# Patient Record
Sex: Female | Born: 1985 | Race: White | Hispanic: No | Marital: Married | Smoking: Never smoker
Health system: Southern US, Community
[De-identification: ages and names within clinical notes are randomized; demographics above are authoritative.]

## PROBLEM LIST (undated history)

## (undated) DIAGNOSIS — M1990 Unspecified osteoarthritis, unspecified site: Secondary | ICD-10-CM

## (undated) DIAGNOSIS — B9689 Other specified bacterial agents as the cause of diseases classified elsewhere: Secondary | ICD-10-CM

## (undated) DIAGNOSIS — M5136 Other intervertebral disc degeneration, lumbar region: Secondary | ICD-10-CM

## (undated) DIAGNOSIS — R87612 Low grade squamous intraepithelial lesion on cytologic smear of cervix (LGSIL): Secondary | ICD-10-CM

## (undated) DIAGNOSIS — F329 Major depressive disorder, single episode, unspecified: Secondary | ICD-10-CM

## (undated) DIAGNOSIS — Z9289 Personal history of other medical treatment: Secondary | ICD-10-CM

## (undated) DIAGNOSIS — N760 Acute vaginitis: Secondary | ICD-10-CM

## (undated) DIAGNOSIS — R87619 Unspecified abnormal cytological findings in specimens from cervix uteri: Secondary | ICD-10-CM

## (undated) DIAGNOSIS — B977 Papillomavirus as the cause of diseases classified elsewhere: Secondary | ICD-10-CM

## (undated) DIAGNOSIS — I499 Cardiac arrhythmia, unspecified: Secondary | ICD-10-CM

## (undated) DIAGNOSIS — A630 Anogenital (venereal) warts: Secondary | ICD-10-CM

## (undated) HISTORY — DX: Low grade squamous intraepithelial lesion on cytologic smear of cervix (LGSIL): R87.612

## (undated) HISTORY — PX: COLPOSCOPY: SHX161

## (undated) HISTORY — DX: Unspecified abnormal cytological findings in specimens from cervix uteri: R87.619

## (undated) HISTORY — DX: Major depressive disorder, single episode, unspecified: F32.9

## (undated) HISTORY — DX: Papillomavirus as the cause of diseases classified elsewhere: B97.7

## (undated) HISTORY — DX: Anogenital (venereal) warts: A63.0

## (undated) HISTORY — DX: Other specified bacterial agents as the cause of diseases classified elsewhere: B96.89

## (undated) HISTORY — DX: Acute vaginitis: N76.0

## (undated) HISTORY — DX: Unspecified osteoarthritis, unspecified site: M19.90

## (undated) HISTORY — DX: Personal history of other medical treatment: Z92.89

## (undated) HISTORY — DX: Other intervertebral disc degeneration, lumbar region: M51.36

---

## 2008-11-23 ENCOUNTER — Emergency Department: Payer: Self-pay | Admitting: Emergency Medicine

## 2009-03-18 DIAGNOSIS — R87612 Low grade squamous intraepithelial lesion on cytologic smear of cervix (LGSIL): Secondary | ICD-10-CM

## 2009-03-18 HISTORY — DX: Low grade squamous intraepithelial lesion on cytologic smear of cervix (LGSIL): R87.612

## 2010-06-11 IMAGING — CT CT HEAD WITHOUT CONTRAST
2 series · 16 of 30 positions shown, 20 images · non-contrast
Comparison: none

REASON FOR EXAM: severe ha
COMMENTS:   LMP: > one month ago

PROCEDURE:     CT  - CT HEAD WITHOUT CONTRAST  - November 23, 2008 [DATE]
RESULT:     No intra-axial or extra-axial pathologic fluid or blood
collections identified. No mass lesions noted. No hydrocephalus noted. No
acute bony undermines identified.

[Series 2: without · axial · non-contrast · 0.42mm/px · z∈[-622,-502]mm · 13 of 30 slices shown, 17 images]
[im 3/30  brain]
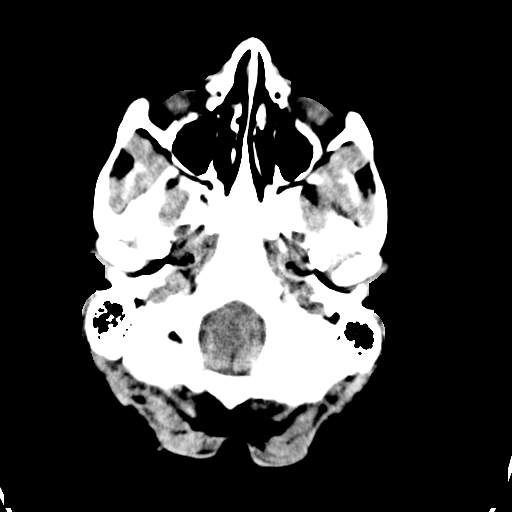
[im 3/30  bone]
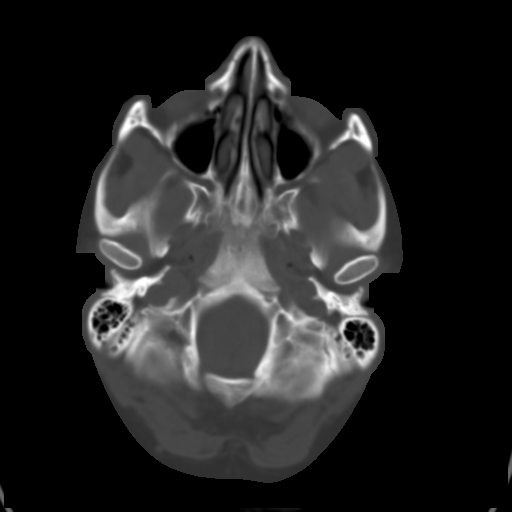
[im 5/30  brain]
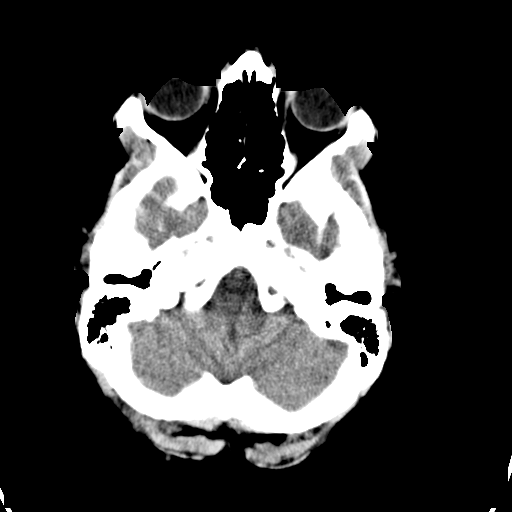
[im 7/30  brain]
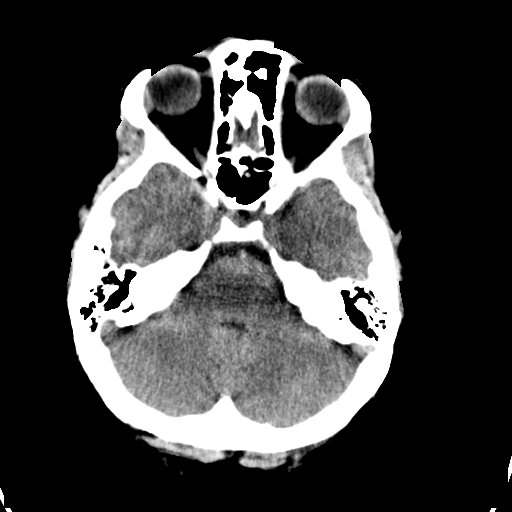
[im 9/30  brain]
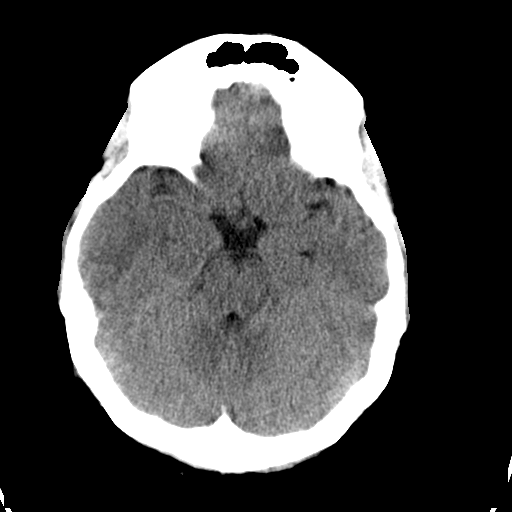
[im 11/30  brain]
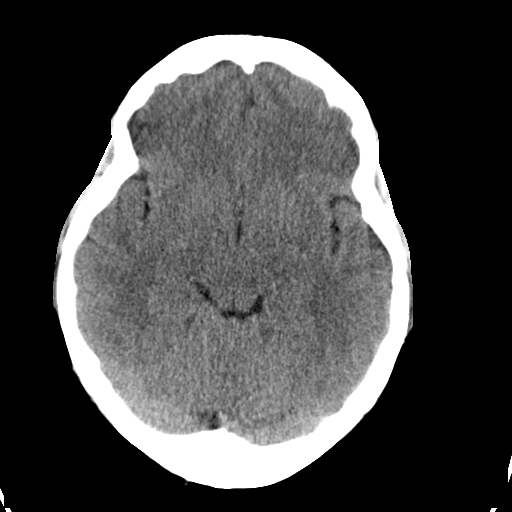
[im 11/30  bone]
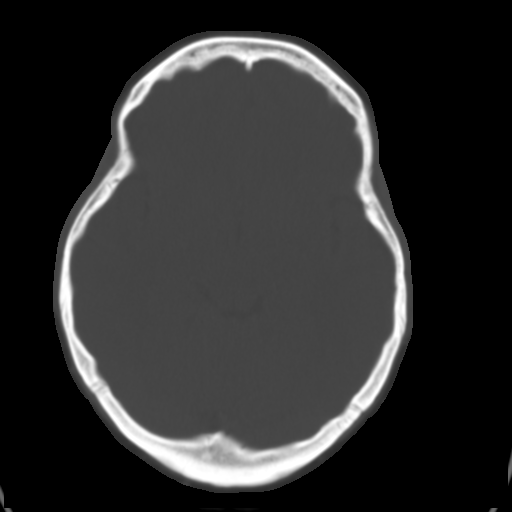
[im 13/30  brain]
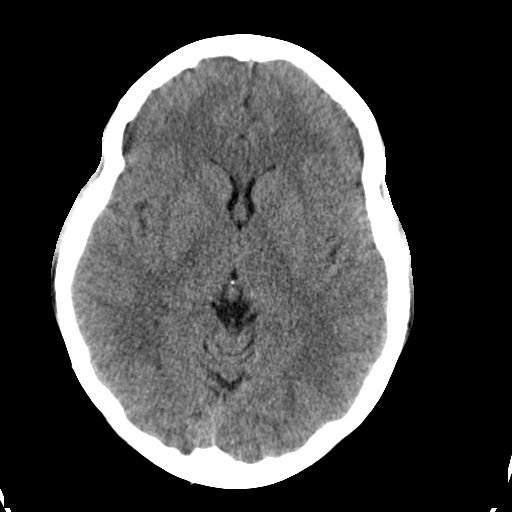
[im 15/30  brain]
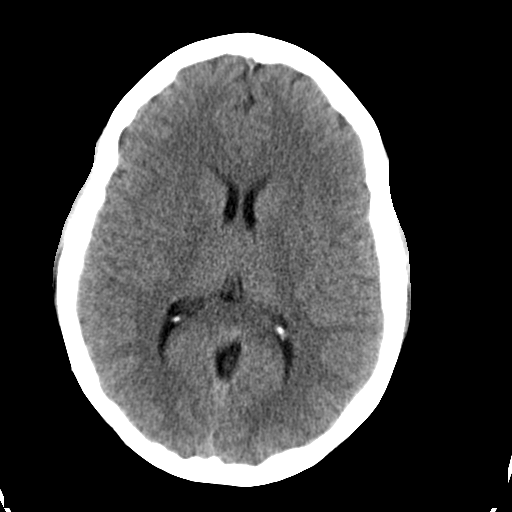
[im 17/30  brain]
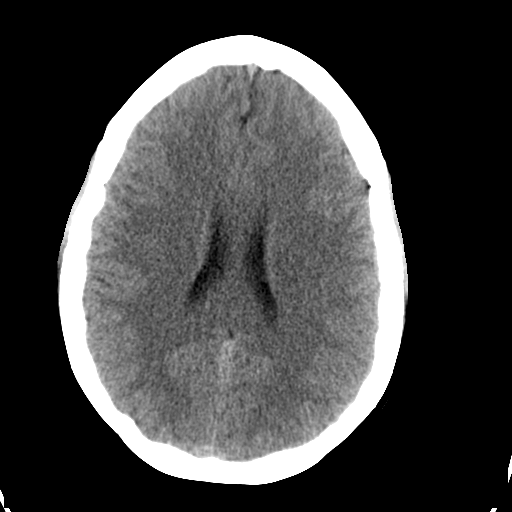
[im 19/30  brain]
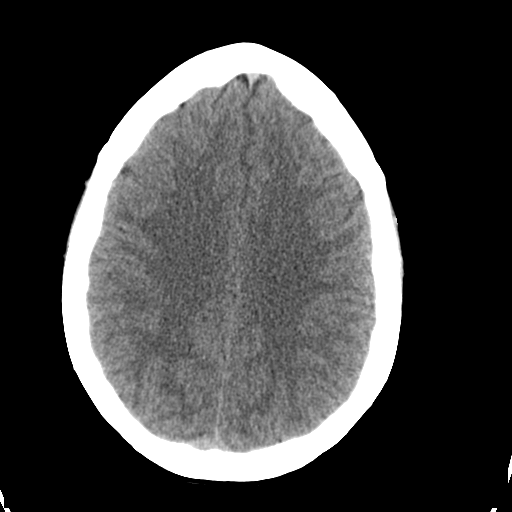
[im 19/30  bone]
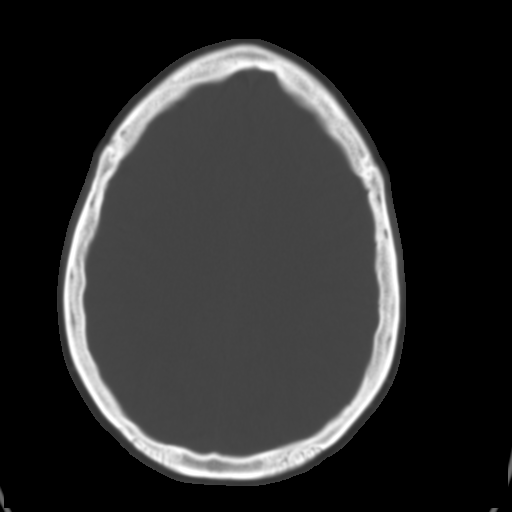
[im 21/30  brain]
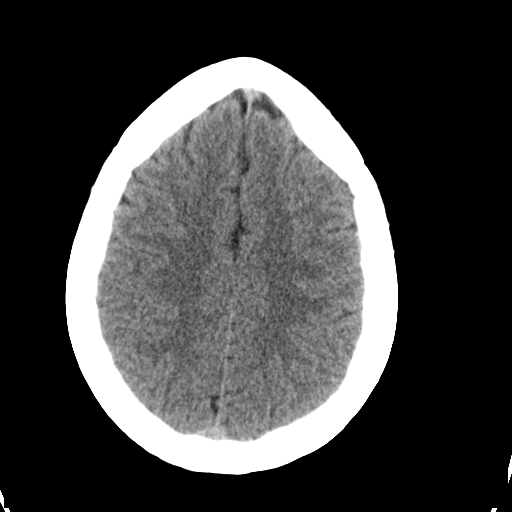
[im 23/30  brain]
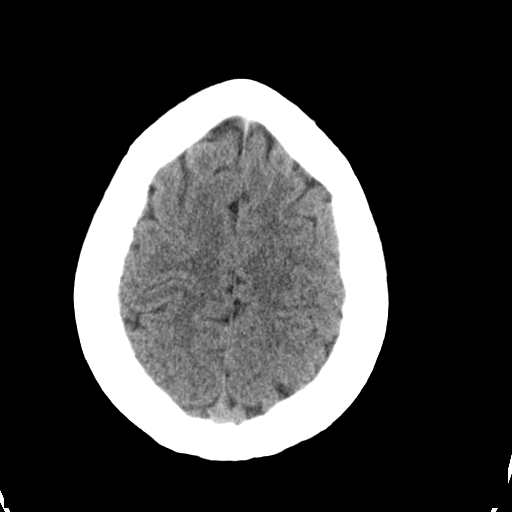
[im 25/30  brain]
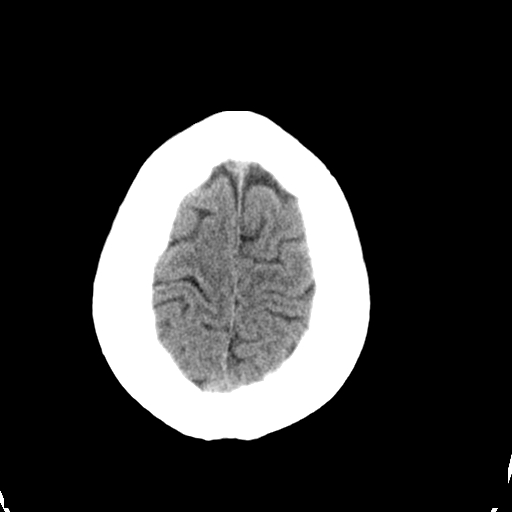
[im 27/30  brain]
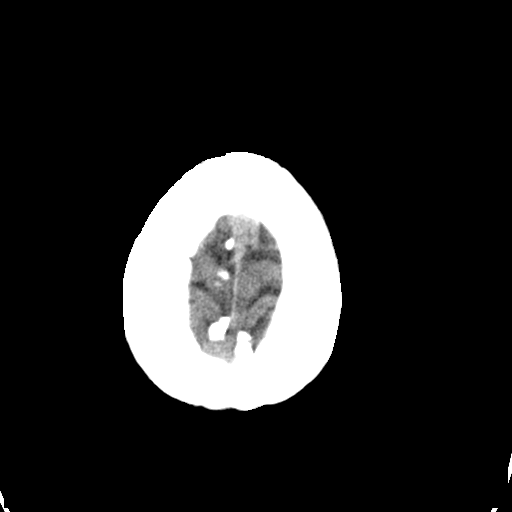
[im 27/30  bone]
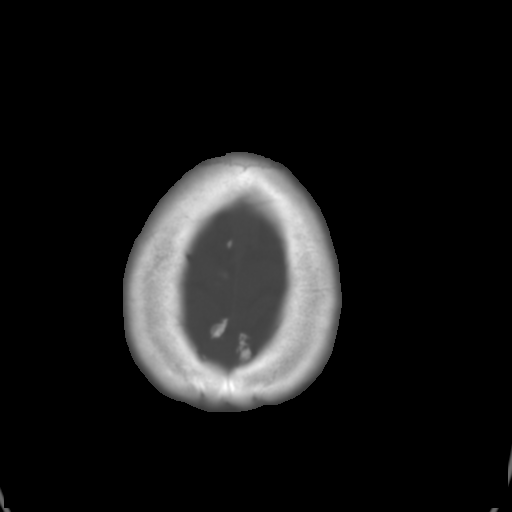

[Series 3: bone · axial · 0.42mm/px · z∈[-622,-582]mm · 3 of 30 slices shown]
[im 3/30  bone]
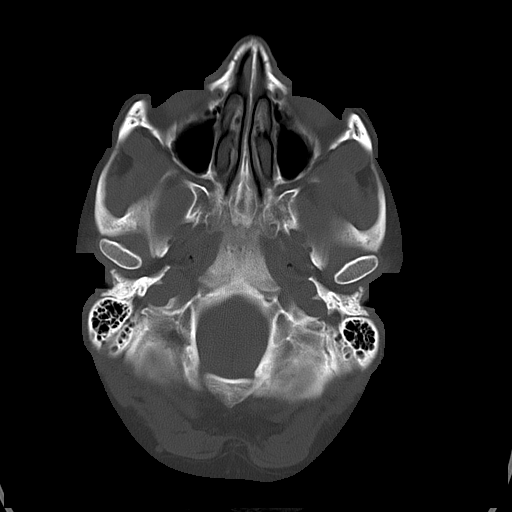
[im 7/30  bone]
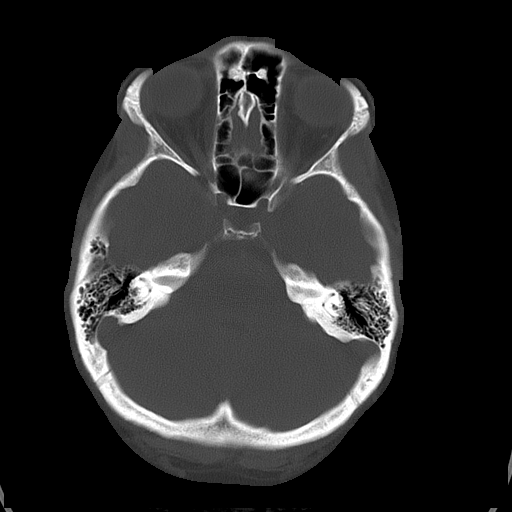
[im 11/30  bone]
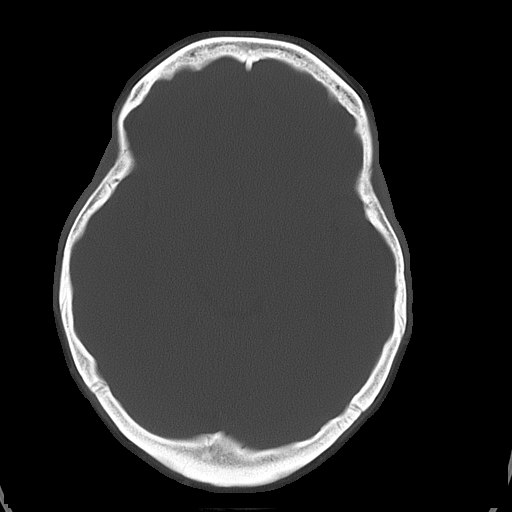

[16 of 30 positions shown; findings below may reference images not displayed]

IMPRESSION: No acute abnormality.

## 2010-06-28 DIAGNOSIS — M5136 Other intervertebral disc degeneration, lumbar region: Secondary | ICD-10-CM

## 2010-06-28 DIAGNOSIS — M51369 Other intervertebral disc degeneration, lumbar region without mention of lumbar back pain or lower extremity pain: Secondary | ICD-10-CM

## 2010-06-28 HISTORY — DX: Other intervertebral disc degeneration, lumbar region: M51.36

## 2010-06-28 HISTORY — DX: Other intervertebral disc degeneration, lumbar region without mention of lumbar back pain or lower extremity pain: M51.369

## 2011-06-29 DIAGNOSIS — F32A Depression, unspecified: Secondary | ICD-10-CM

## 2011-06-29 HISTORY — DX: Depression, unspecified: F32.A

## 2013-10-24 HISTORY — PX: INTRAUTERINE DEVICE (IUD) INSERTION: SHX5877

## 2014-04-26 DIAGNOSIS — Z9289 Personal history of other medical treatment: Secondary | ICD-10-CM

## 2014-04-26 HISTORY — DX: Personal history of other medical treatment: Z92.89

## 2016-10-04 ENCOUNTER — Telehealth: Payer: Self-pay | Admitting: Obstetrics and Gynecology

## 2016-10-04 NOTE — Telephone Encounter (Signed)
Pt coming 4/23 at 11:10 for skyla removal and kyleena insert.   Pt also stated that [facility] would call in something to soften her cervix since she does not get a cycle anymore.[facility] in [place]

## 2016-10-05 ENCOUNTER — Other Ambulatory Visit: Payer: Self-pay | Admitting: Obstetrics and Gynecology

## 2016-10-05 MED ORDER — MISOPROSTOL 100 MCG PO TABS
100.0000 ug | ORAL_TABLET | Freq: Once | ORAL | 0 refills | Status: DC
Start: 2016-10-05 — End: 2017-08-04

## 2016-10-05 NOTE — Telephone Encounter (Signed)
Rx sent 

## 2016-10-05 NOTE — Progress Notes (Signed)
yt

## 2016-10-05 NOTE — Telephone Encounter (Signed)
Noted. Will order to arrive by apt date/time. Msg to [facility] for rx.

## 2016-10-18 ENCOUNTER — Other Ambulatory Visit: Payer: Self-pay | Admitting: Obstetrics and Gynecology

## 2016-10-18 ENCOUNTER — Encounter: Payer: Self-pay | Admitting: Obstetrics and Gynecology

## 2016-10-18 ENCOUNTER — Ambulatory Visit (INDEPENDENT_AMBULATORY_CARE_PROVIDER_SITE_OTHER): Payer: PRIVATE HEALTH INSURANCE | Admitting: Obstetrics and Gynecology

## 2016-10-18 VITALS — BP 122/90 | HR 48 | Ht 63.0 in | Wt 286.0 lb

## 2016-10-18 DIAGNOSIS — Z131 Encounter for screening for diabetes mellitus: Secondary | ICD-10-CM

## 2016-10-18 DIAGNOSIS — Z13220 Encounter for screening for lipoid disorders: Secondary | ICD-10-CM

## 2016-10-18 DIAGNOSIS — Z0000 Encounter for general adult medical examination without abnormal findings: Secondary | ICD-10-CM

## 2016-10-18 DIAGNOSIS — Z30433 Encounter for removal and reinsertion of intrauterine contraceptive device: Secondary | ICD-10-CM

## 2016-10-18 NOTE — Patient Instructions (Signed)

## 2016-10-18 NOTE — Progress Notes (Signed)
   Chief Complaint  Patient presents with  . Procedure    skyla iud removal  . Procedure    kyleena iud insertion     History of Present Illness:  Lisvet Rasheed is a 31 y.o. that had a Skyla IUD placed approximately 3 years ago. Since that time, she states she has done well with the IUD. She would like a replacement today and plans to get the [facility].   BP 122/90 (Patient Position: Sitting)   Pulse (!) 48   Ht  (1.6 m)   Wt 286 lb (129.7 kg)   LMP  (LMP Unknown) Comment: iud  BMI 50.66 kg/m   Pelvic exam:  Two IUD strings present seen coming from the cervical os. EGBUS, vaginal vault and cervix: within normal limits  IUD Removal Strings of IUD identified and grasped.  IUD removed without problem with ring forceps.  Pt tolerated this well.  IUD noted to be intact.   IUD INSERTION NOTE:  Kanika Bungert is a 31 y.o. G0P0000 here for [facility]  IUD insertion. No GYN concerns.  Last pap smear was normal.  BP 122/90 (Patient Position: Sitting)   Pulse (!) 48   Ht  (1.6 m)   Wt 286 lb (129.7 kg)   LMP  (LMP Unknown) Comment: iud  BMI 50.66 kg/m   IUD Insertion Procedure Note Patient identified, informed consent performed, consent signed.   Discussed risks of irregular bleeding, cramping, infection, malpositioning or misplacement of the IUD outside the uterus which may require further procedure such as laparoscopy, risk of failure <1%. Time out was performed.   A bimanual exam showed the uterus to be anteverted.  Speculum placed in the vagina.  Cervix visualized.  Cleaned with Betadine x 2.  Grasped anteriorly with a single tooth tenaculum.  Uterus sounded to 8.0 cm.   IUD placed per manufacturer's recommendations.  Strings trimmed to 3 cm. Tenaculum was removed, good hemostasis noted.  Patient tolerated procedure well.   ASSESSMENT: Encounter for removal and reinsertion of intrauterine contraceptive device (IUD)  Blood tests for routine general physical examination -  Labs needed for work physical. Will call pt wiht results and fax labs to work.  Screening cholesterol level  Screening for diabetes mellitus    Meds ordered this encounter  Medications  . Levonorgestrel (KYLEENA) 19.5 MG IUD    Sig: 1 Device by Intrauterine route.     Plan:  Patient was given post-procedure instructions.  She was advised to have backup contraception for one week.   Call if you are having increasing pain, cramps or bleeding or if you have a fever greater than 100.4 degrees F., shaking chills, nausea or vomiting. Patient was also asked to check IUD strings periodically and follow up in 4 weeks for IUD check.  Return in about 4 weeks (around 11/15/2016) for IUD string check.  Tanija Germani B. Christophe Rising, PA-C 10/18/2016 11:42 AM

## 2016-10-29 LAB — COMPREHENSIVE METABOLIC PANEL
A/G RATIO: 1.4 (ref 1.2–2.2)
ALBUMIN: 4 g/dL (ref 3.5–5.5)
ALT: 16 IU/L (ref 0–32)
AST: 15 IU/L (ref 0–40)
Alkaline Phosphatase: 59 IU/L (ref 39–117)
BUN/Creatinine Ratio: 15 (ref 9–23)
BUN: 11 mg/dL (ref 6–20)
Bilirubin Total: 0.5 mg/dL (ref 0.0–1.2)
CO2: 25 mmol/L (ref 18–29)
CREATININE: 0.71 mg/dL (ref 0.57–1.00)
Calcium: 9.2 mg/dL (ref 8.7–10.2)
Chloride: 103 mmol/L (ref 96–106)
GFR, EST AFRICAN AMERICAN: 131 mL/min/{1.73_m2} (ref >59)
GFR, EST NON AFRICAN AMERICAN: 114 mL/min/{1.73_m2} (ref >59)
GLOBULIN, TOTAL: 2.9 g/dL (ref 1.5–4.5)
GLUCOSE: 97 mg/dL (ref 65–99)
POTASSIUM: 4.2 mmol/L (ref 3.5–5.2)
SODIUM: 141 mmol/L (ref 134–144)
TOTAL PROTEIN: 6.9 g/dL (ref 6.0–8.5)

## 2016-10-29 LAB — LIPID PANEL
CHOL/HDL RATIO: 3 ratio (ref 0.0–4.4)
CHOLESTEROL TOTAL: 166 mg/dL (ref 100–199)
HDL: 55 mg/dL (ref >39)
LDL Calculated: 101 mg/dL — ABNORMAL HIGH (ref 0–99)
TRIGLYCERIDES: 50 mg/dL (ref 0–149)
VLDL Cholesterol Cal: 10 mg/dL (ref 5–40)

## 2016-10-29 LAB — HEMOGLOBIN A1C
ESTIMATED AVERAGE GLUCOSE: 105 mg/dL
Hgb A1c MFr Bld: 5.3 % (ref 4.8–5.6)

## 2016-11-15 ENCOUNTER — Encounter: Payer: Self-pay | Admitting: Obstetrics and Gynecology

## 2016-11-15 ENCOUNTER — Ambulatory Visit (INDEPENDENT_AMBULATORY_CARE_PROVIDER_SITE_OTHER): Payer: PRIVATE HEALTH INSURANCE | Admitting: Obstetrics and Gynecology

## 2016-11-15 VITALS — BP 126/74 | Ht 63.0 in | Wt 290.0 lb

## 2016-11-15 DIAGNOSIS — Z30431 Encounter for routine checking of intrauterine contraceptive device: Secondary | ICD-10-CM

## 2016-11-15 NOTE — Progress Notes (Signed)
   Chief Complaint  Patient presents with  . Follow-up    IUD string check     History of Present Illness:  Kristina Mccall is a 31 y.o. that had a kyleena IUD placed approximately 1 month ago. Since that time, she denies dyspareunia, pelvic pain, non-menstrual bleeding, vaginal d/c, heavy bleeding. She is doing well.  Review of Systems  Physical Exam:  BP 126/74   Ht 5\' 3"  (1.6 m)   Wt 290 lb (131.5 kg)   LMP  (LMP Unknown) Comment: iud  BMI 51.37 kg/m  Body mass index is 51.37 kg/m.  Pelvic exam:  Two IUD strings present seen coming from the cervical os. EGBUS, vaginal vault and cervix: within normal limits   Assessment:  Routine checking of IUD Encounter for routine checking of intrauterine contraceptive device (IUD) - IUD in place. Doing well.   IUD strings present in proper location; pt doing well  Plan: F/u if any signs of infection or can no longer feel the strings.   Graceland Wachter B. Jenay Morici, PA-C 11/15/2016 2:12 PM

## 2017-06-09 ENCOUNTER — Ambulatory Visit: Payer: PRIVATE HEALTH INSURANCE | Admitting: Obstetrics and Gynecology

## 2017-08-04 ENCOUNTER — Ambulatory Visit (INDEPENDENT_AMBULATORY_CARE_PROVIDER_SITE_OTHER): Payer: PRIVATE HEALTH INSURANCE | Admitting: Obstetrics and Gynecology

## 2017-08-04 ENCOUNTER — Encounter: Payer: Self-pay | Admitting: Obstetrics and Gynecology

## 2017-08-04 VITALS — BP 116/82 | HR 82 | Ht 63.0 in | Wt 302.0 lb

## 2017-08-04 DIAGNOSIS — Z30431 Encounter for routine checking of intrauterine contraceptive device: Secondary | ICD-10-CM

## 2017-08-04 DIAGNOSIS — Z1151 Encounter for screening for human papillomavirus (HPV): Secondary | ICD-10-CM

## 2017-08-04 DIAGNOSIS — Z01419 Encounter for gynecological examination (general) (routine) without abnormal findings: Secondary | ICD-10-CM | POA: Diagnosis not present

## 2017-08-04 DIAGNOSIS — Z124 Encounter for screening for malignant neoplasm of cervix: Secondary | ICD-10-CM | POA: Diagnosis not present

## 2017-08-04 DIAGNOSIS — F419 Anxiety disorder, unspecified: Secondary | ICD-10-CM

## 2017-08-04 MED ORDER — PAROXETINE HCL 10 MG PO TABS: 10.0000 mg | ORAL_TABLET | Freq: Every day | ORAL | 3 refills | Status: DC

## 2017-08-04 NOTE — Patient Instructions (Signed)
I value your feedback and entrusting us with your care. If you get a [facility] patient survey, I would appreciate you taking the time to let us know about your experience today. Thank you! 

## 2017-08-04 NOTE — Progress Notes (Signed)
PCP:  Patient, No Pcp Per   Chief Complaint  Patient presents with  . Gynecologic Exam     HPI:      [name] is a 32 y.o. G0P0000 who LMP was No LMP recorded. Patient is not currently having periods (Reason: IUD)., presents today for her annual examination.  Her menses are absent with IUD. Dysmenorrhea none. She does not have intermenstrual bleeding.  Sex activity: single partner, contraception - IUD. Kyleena placed 10/18/16 Last Pap: April 29, 2016  Results were: no abnormalities  Hx of STDs: none  There is no FH of breast cancer. There is no FH of ovarian cancer. The patient does do self-breast exams.  Tobacco use: The patient denies current or previous tobacco use. Alcohol use: social drinker No drug use.  Exercise: moderately active  She does get adequate calcium and Vitamin D in her diet.  Trying to lose wt.   Pt still has anxiety. Took paxil 10mg  daily last yr with sx improvement. Went off it but feels she needs to restart. Felt stoic with zoloft in the past. Sx are sometimes exacerbated by stress. Has panic attacks with heart palpitations sometimes. Has decreased caffeine with heart sx improvement. No depression sx.   Past Medical History:  Diagnosis Date  . Abnormal Pap smear of cervix 05/28/2008;01/29/08;11/27/2012  . Arthritis   . Bacterial vaginosis   . Condyloma   . Degenerative disc disease, lumbar 06/2010  . Depression 2013  . History of Papanicolaou smear of cervix 04/26/2014   -/-; 04/29/2015 neg  . 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 COLPOSCOPY  2009/2010   no bx done  . INTRAUTERINE DEVICE (IUD) INSERTION  10/24/2013    Family History  Problem Relation Age of Onset  . Cancer Mother        cervical  . Hyperlipidemia Mother   . Hypertension Mother   . Hyperlipidemia  Father   . Diabetes Maternal Grandmother   . Heart disease Maternal Grandfather     Social History   Socioeconomic History  . Marital status: Single    Spouse name: Not on file  . Number of children: Not on file  . Years of education: Not on file  . Highest education level: Not on file  Social Needs  . Financial resource strain: Not on file  . Food insecurity - worry: Not on file  . Food insecurity - inability: Not on file  . Transportation needs - medical: Not on file  . Transportation needs - non-medical: Not on file  Occupational History  . Not on file  Tobacco Use  . Smoking status: Never Smoker  . Smokeless tobacco: Never Used  Substance and Sexual Activity  . Alcohol use: Yes    Comment: socially  . Drug use: No  . Sexual activity: Yes    Birth control/protection: IUD  Other Topics Concern  . Not on file  Social History Narrative  . Not on file    Current Meds  Medication Sig  . Levonorgestrel (KYLEENA) 19.5 MG IUD 1 Device by Intrauterine route.     ROS:  Review of Systems  Constitutional: Negative for fatigue, fever and unexpected weight change.  Respiratory: Negative for cough, shortness of breath and wheezing.   Cardiovascular: Negative for chest pain, palpitations and leg swelling.  Gastrointestinal: Negative  for blood in stool, constipation, diarrhea, nausea and vomiting.  Endocrine: Negative for cold intolerance, heat intolerance and polyuria.  Genitourinary: Negative for dyspareunia, dysuria, flank pain, frequency, genital sores, hematuria, menstrual problem, pelvic pain, urgency, vaginal bleeding, vaginal discharge and vaginal pain.  Musculoskeletal: Negative for back pain, joint swelling and myalgias.  Skin: Negative for rash.  Neurological: Negative for dizziness, syncope, light-headedness, numbness and headaches.  Hematological: Negative for adenopathy.  Psychiatric/Behavioral: Positive for agitation. Negative for confusion, dysphoric mood,  hallucinations, sleep disturbance and suicidal ideas. The patient is not nervous/anxious.      Objective: BP 116/82   Pulse 82   Ht 5\' 3"  (1.6 m)   Wt (!) 302 lb (137 kg)   BMI 53.50 kg/m    Physical Exam  Constitutional: She is oriented to person, place, and time. She appears well-developed and well-nourished.  Genitourinary: Vagina normal and uterus normal. There is no rash or tenderness on the right labia. There is no rash or tenderness on the left labia. No erythema or tenderness in the vagina. No vaginal discharge found. Right adnexum does not display mass and does not display tenderness. Left adnexum does not display mass and does not display tenderness.  Cervix exhibits visible IUD strings. Cervix does not exhibit motion tenderness or polyp. Uterus is not enlarged or tender.  Neck: Normal range of motion. No thyromegaly present.  Cardiovascular: Normal rate, regular rhythm and normal heart sounds.  No murmur heard. Pulmonary/Chest: Effort normal and breath sounds normal. Right breast exhibits no mass, no nipple discharge, no skin change and no tenderness. Left breast exhibits no mass, no nipple discharge, no skin change and no tenderness.  Abdominal: Soft. There is no tenderness. There is no guarding.  Musculoskeletal: Normal range of motion.  Neurological: She is alert and oriented to person, place, and time. No cranial nerve deficit.  Psychiatric: She has a normal mood and affect. Her behavior is normal.  Vitals reviewed.   Assessment/Plan: Encounter for annual routine gynecological examination  Cervical cancer screening - Plan: IGP, Aptima HPV  Screening for HPV (human papillomavirus) - Plan: IGP, Aptima HPV  Encounter for routine checking of intrauterine contraceptive device (IUD) - IUD in place. Due for rem 4/23.  Anxiety - Restart paxil 10 mg. Rx eRxd. F/u prn. - Plan: PARoxetine (PAXIL) 10 MG tablet  Meds ordered this encounter  Medications  . PARoxetine (PAXIL)  10 MG tablet    Sig: Take 1 tablet (10 mg total) by mouth daily.    Dispense:  90 tablet    Refill:  3    Order Specific Question:   Supervising Provider    Answer:   Nadara MustardHARRIS, ROBERT P [[id number]]             GYN counsel adequate intake of calcium and vitamin D, diet and exercise     F/U  Return in about 1 year (around 08/04/2018).  Wretha Laris B. Terrion Poblano, PA-C 08/04/2017 2:48 PM

## 2017-08-06 LAB — IGP, APTIMA HPV
HPV APTIMA: NEGATIVE
PAP SMEAR COMMENT: 0

## 2017-08-11 ENCOUNTER — Telehealth: Payer: Self-pay

## 2017-08-11 NOTE — Telephone Encounter (Signed)
Pt is [facility] for annual work physical/insurance requirements. She is aware they were last done in May of last year but insurance requires every calendar year regardless. CMP, lipid panel and hemoglobin A1c. Pt will schedule lab appt once orders in. Thank you!

## 2017-08-15 ENCOUNTER — Other Ambulatory Visit: Payer: Self-pay | Admitting: Obstetrics and Gynecology

## 2017-08-15 DIAGNOSIS — Z0000 Encounter for general adult medical examination without abnormal findings: Secondary | ICD-10-CM

## 2017-08-15 DIAGNOSIS — Z131 Encounter for screening for diabetes mellitus: Secondary | ICD-10-CM

## 2017-08-15 DIAGNOSIS — Z13220 Encounter for screening for lipoid disorders: Secondary | ICD-10-CM

## 2017-08-15 NOTE — Telephone Encounter (Signed)
Lab orders placed. Pls notify pt. Thx. 

## 2017-08-15 NOTE — Telephone Encounter (Signed)
Pt aware and will contact office to schedule lab appt.

## 2017-08-15 NOTE — Progress Notes (Signed)
Lab orders for work.

## 2017-10-07 ENCOUNTER — Other Ambulatory Visit: Payer: PRIVATE HEALTH INSURANCE

## 2017-10-07 DIAGNOSIS — Z0000 Encounter for general adult medical examination without abnormal findings: Secondary | ICD-10-CM

## 2017-10-07 DIAGNOSIS — Z13220 Encounter for screening for lipoid disorders: Secondary | ICD-10-CM

## 2017-10-07 DIAGNOSIS — Z131 Encounter for screening for diabetes mellitus: Secondary | ICD-10-CM

## 2017-10-08 LAB — COMPREHENSIVE METABOLIC PANEL
ALT: 23 IU/L (ref 0–32)
AST: 22 IU/L (ref 0–40)
Albumin/Globulin Ratio: 1.3 (ref 1.2–2.2)
Albumin: 4 g/dL (ref 3.5–5.5)
Alkaline Phosphatase: 78 IU/L (ref 39–117)
BUN/Creatinine Ratio: 18 (ref 9–23)
BUN: 12 mg/dL (ref 6–20)
Bilirubin Total: 0.5 mg/dL (ref 0.0–1.2)
CALCIUM: 9.2 mg/dL (ref 8.7–10.2)
CO2: 23 mmol/L (ref 20–29)
Chloride: 104 mmol/L (ref 96–106)
Creatinine, Ser: 0.67 mg/dL (ref 0.57–1.00)
GFR, EST AFRICAN AMERICAN: 135 mL/min/{1.73_m2} (ref >59)
GFR, EST NON AFRICAN AMERICAN: 117 mL/min/{1.73_m2} (ref >59)
GLUCOSE: 86 mg/dL (ref 65–99)
Globulin, Total: 3 g/dL (ref 1.5–4.5)
Potassium: 4 mmol/L (ref 3.5–5.2)
Sodium: 140 mmol/L (ref 134–144)
TOTAL PROTEIN: 7 g/dL (ref 6.0–8.5)

## 2017-10-08 LAB — LIPID PANEL
CHOL/HDL RATIO: 2.8 ratio (ref 0.0–4.4)
Cholesterol, Total: 163 mg/dL (ref 100–199)
HDL: 58 mg/dL (ref >39)
LDL CALC: 94 mg/dL (ref 0–99)
TRIGLYCERIDES: 56 mg/dL (ref 0–149)
VLDL Cholesterol Cal: 11 mg/dL (ref 5–40)

## 2017-10-08 LAB — HEMOGLOBIN A1C
Est. average glucose Bld gHb Est-mCnc: 111 mg/dL
HEMOGLOBIN A1C: 5.5 % (ref 4.8–5.6)

## 2017-10-09 ENCOUNTER — Encounter: Payer: Self-pay | Admitting: Obstetrics and Gynecology

## 2017-10-09 ENCOUNTER — Other Ambulatory Visit: Payer: Self-pay | Admitting: Obstetrics and Gynecology

## 2018-05-29 ENCOUNTER — Encounter: Payer: Self-pay | Admitting: Obstetrics and Gynecology

## 2018-05-29 ENCOUNTER — Ambulatory Visit (INDEPENDENT_AMBULATORY_CARE_PROVIDER_SITE_OTHER): Payer: PRIVATE HEALTH INSURANCE | Admitting: Obstetrics and Gynecology

## 2018-05-29 VITALS — BP 120/86 | HR 69 | Ht 63.0 in | Wt 312.0 lb

## 2018-05-29 DIAGNOSIS — R6882 Decreased libido: Secondary | ICD-10-CM | POA: Insufficient documentation

## 2018-05-29 DIAGNOSIS — F329 Major depressive disorder, single episode, unspecified: Secondary | ICD-10-CM

## 2018-05-29 DIAGNOSIS — F32A Depression, unspecified: Secondary | ICD-10-CM

## 2018-05-29 DIAGNOSIS — Z30431 Encounter for routine checking of intrauterine contraceptive device: Secondary | ICD-10-CM | POA: Diagnosis not present

## 2018-05-29 DIAGNOSIS — N898 Other specified noninflammatory disorders of vagina: Secondary | ICD-10-CM

## 2018-05-29 DIAGNOSIS — F419 Anxiety disorder, unspecified: Secondary | ICD-10-CM

## 2018-05-29 LAB — POCT WET PREP WITH KOH
Clue Cells Wet Prep HPF POC: NEGATIVE
KOH Prep POC: NEGATIVE
Trichomonas, UA: NEGATIVE
Yeast Wet Prep HPF POC: NEGATIVE

## 2018-05-29 MED ORDER — PAROXETINE HCL 10 MG PO TABS: ORAL_TABLET | ORAL | 1 refills | Status: DC

## 2018-05-29 NOTE — Progress Notes (Signed)
Patient, No Pcp Per   Chief Complaint  Patient presents with  . Vaginal Discharge    thin discharge for about 1 month, not everyday discharge, no odor, itchiness, or irritation, no pelvic pain    HPI:      [name] is a 32 y.o. G0P0000 who LMP was No LMP recorded. (Menstrual status: IUD)., presents today for increased watery d/c for the past month. No itching, irritation, odor. No meds to treat. No urin sx, no LBP, belly pain, fevers.  Kyleena placed 10/18/16. Doing well. Has occas spotting, monthly cramping when menses due.  Pt also with complaints of decreased sex drive for about 6 months. Recently married but pt isn't interested in sex. Not on any meds that could cause sx. Pt not exercising due to work sched. Also has mood changes, irritability, decreased concentration and motivation, and increased worry. Feels like Dr. Domenick GongJekyll and Mr. Sheppard PlumberHyde at times. No relationship issues with husband. Long hx of anxiety. Did zoloft in past but felt numb. Did well with paxil in 2017 but stopped it awhile back because things "were really good".  No SI.   Past Medical History:  Diagnosis Date  . Abnormal Pap smear of cervix 05/28/2008;01/29/08;11/27/2012  . Arthritis   . Bacterial vaginosis   . Condyloma   . Degenerative disc disease, lumbar 06/2010  . Depression 2013  . History of Papanicolaou smear of cervix 04/26/2014   -/-; 04/29/2015 neg  . 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 COLPOSCOPY  2009/2010   no bx done  . INTRAUTERINE DEVICE (IUD) INSERTION  10/24/2013    Family History  Problem Relation Age of Onset  . Cancer Mother        cervical  . Hyperlipidemia Mother   . Hypertension Mother   . Hyperlipidemia Father   . Diabetes Maternal Grandmother   . Heart disease Maternal Grandfather     Social  History   Socioeconomic History  . Marital status: Single    Spouse name: Not on file  . Number of children: Not on file  . Years of education: Not on file  . Highest education level: Not on file  Occupational History  . Not on file  Social Needs  . Financial resource strain: Not on file  . Food insecurity:    Worry: Not on file    Inability: Not on file  . Transportation needs:    Medical: Not on file    Non-medical: Not on file  Tobacco Use  . Smoking status: Never Smoker  . Smokeless tobacco: Never Used  Substance and Sexual Activity  . Alcohol use: Yes    Comment: socially  . Drug use: No  . Sexual activity: Yes    Birth control/protection: IUD    Comment: Kyleena  Lifestyle  . Physical activity:    Days per week: 4 days    Minutes per session: 60 min  . Stress: To some extent  Relationships  . Social connections:    Talks on phone: More than three times a week    Gets together: More than three times a week    Attends religious service: Never    Active member of club or organization: No    Attends meetings of clubs or organizations: Never    Relationship status: Living with  partner  . Intimate partner violence:    Fear of current or ex partner: No    Emotionally abused: No    Physically abused: No    Forced sexual activity: No  Other Topics Concern  . Not on file  Social History Narrative  . Not on file    Outpatient Medications Prior to Visit  Medication Sig Dispense Refill  . ibuprofen (ADVIL,MOTRIN) 800 MG tablet     . Levonorgestrel (KYLEENA) 19.5 MG IUD 1 Device by Intrauterine route.    Marland Kitchen PARoxetine (PAXIL) 10 MG tablet Take 1 tablet (10 mg total) by mouth daily. 90 tablet 3   No facility-administered medications prior to visit.       ROS:  Review of Systems  Constitutional: Negative for fever.  Gastrointestinal: Negative for blood in stool, constipation, diarrhea, nausea and vomiting.  Genitourinary: Positive for vaginal discharge.  Negative for dyspareunia, dysuria, flank pain, frequency, hematuria, urgency, vaginal bleeding and vaginal pain.  Musculoskeletal: Negative for back pain.  Skin: Negative for rash.  Psychiatric/Behavioral: Positive for agitation and dysphoric mood. Negative for hallucinations, self-injury and suicidal ideas.   BREAST: No symptoms   OBJECTIVE:   Vitals:  BP 120/86   Pulse 69   Ht 5\' 3"  (1.6 m)   Wt (!) 312 lb (141.5 kg)   BMI 55.27 kg/m   Physical Exam  Constitutional: She is oriented to person, place, and time. Vital signs are normal. She appears well-developed.  Pulmonary/Chest: Effort normal.  Genitourinary: Uterus normal. There is no rash, tenderness or lesion on the right labia. There is no rash, tenderness or lesion on the left labia. Uterus is not enlarged and not tender. Cervix exhibits no motion tenderness. Right adnexum displays no mass and no tenderness. Left adnexum displays no mass and no tenderness. There is bleeding in the vagina. No erythema or tenderness in the vagina. No vaginal discharge found.  Musculoskeletal: Normal range of motion.  Neurological: She is alert and oriented to person, place, and time.  Psychiatric: She has a normal mood and affect. Her behavior is normal. Thought content normal.  Vitals reviewed.   Results: Results for orders placed or performed in visit on 05/29/18 (from the past 24 hour(s))  POCT Wet Prep with KOH     Status: Normal   Collection Time: 05/29/18 12:17 PM  Result Value Ref Range   Trichomonas, UA Negative    Clue Cells Wet Prep HPF POC neg    Epithelial Wet Prep HPF POC     Yeast Wet Prep HPF POC neg    Bacteria Wet Prep HPF POC     RBC Wet Prep HPF POC     WBC Wet Prep HPF POC     KOH Prep POC Negative Negative   GAD 7 : Generalized Anxiety Score 05/29/2018  Nervous, Anxious, on Edge 2  Control/stop worrying 1  Worry too much - different things 1  Trouble relaxing 2  Restless 3  Easily annoyed or irritable 3  Afraid  - awful might happen 1  Total GAD 7 Score 13  Anxiety Difficulty Somewhat difficult     Depression screen PHQ 2/9 05/29/2018  Decreased Interest 1  Down, Depressed, Hopeless 1  PHQ - 2 Score 2  Altered sleeping 0  Tired, decreased energy 1  Change in appetite 2  Feeling bad or failure about yourself  2  Trouble concentrating 2  Moving slowly or fidgety/restless 2  Suicidal thoughts 0  PHQ-9 Score 11  Difficult doing  work/chores Somewhat difficult     Assessment/Plan: Vaginal discharge - Neg wet prep/exam. Check One Swab cultur for AV, BV. If neg, most likely normal physiologic d/c.  - Plan: Other/Misc lab test, POCT Wet Prep with KOH  Encounter for routine checking of intrauterine contraceptive device (IUD) - IUD in place.   Anxiety and depression - Pt with sx, question cause of decreased libido. Will restart paxil. Rx eRxd. RTO in 7 wks for f/u/sooner prn. - Plan: PARoxetine (PAXIL) 10 MG tablet  Decreased libido    Meds ordered this encounter  Medications  . PARoxetine (PAXIL) 10 MG tablet    Sig: Take 1/2 tab daily for 6 days, then 1 tab daily    Dispense:  30 tablet    Refill:  1    Order Specific Question:   Supervising Provider    Answer:   Nadara Mustard [[id number]]      Return in about 7 weeks (around 07/17/2018) for anxiety f/u.  Tejah Brekke B. Dannilynn Gallina, PA-C 05/29/2018 1:46 PM

## 2018-05-29 NOTE — Patient Instructions (Signed)
I value your feedback and entrusting us with your care. If you get a [facility] patient survey, I would appreciate you taking the time to let us know about your experience today. Thank you! 

## 2018-06-06 ENCOUNTER — Telehealth: Payer: Self-pay | Admitting: Obstetrics and Gynecology

## 2018-06-06 MED ORDER — CLINDAMYCIN PHOSPHATE 2 % VA CREA: 1.0000 | TOPICAL_CREAM | Freq: Every day | VAGINAL | 0 refills | Status: AC

## 2018-06-06 NOTE — Telephone Encounter (Signed)
Pt aware of GBS on One Swab culture. Neg BV. Will treat due to sx. Rx cleocin for 1 wk. Allergic to cipro and PCNs, both are first line in GBS tx. F/u prn. Doing well with paxil and yoga.

## 2018-12-14 ENCOUNTER — Encounter: Payer: Self-pay | Admitting: Obstetrics and Gynecology

## 2018-12-14 DIAGNOSIS — E162 Hypoglycemia, unspecified: Secondary | ICD-10-CM

## 2018-12-14 DIAGNOSIS — Z0000 Encounter for general adult medical examination without abnormal findings: Secondary | ICD-10-CM

## 2018-12-14 DIAGNOSIS — Z6841 Body Mass Index (BMI) 40.0 and over, adult: Secondary | ICD-10-CM

## 2018-12-14 DIAGNOSIS — Z131 Encounter for screening for diabetes mellitus: Secondary | ICD-10-CM

## 2018-12-14 NOTE — Telephone Encounter (Signed)
Check CMP and DM labs given hypoglycemia sx.

## 2018-12-15 ENCOUNTER — Other Ambulatory Visit: Payer: PRIVATE HEALTH INSURANCE

## 2018-12-15 ENCOUNTER — Other Ambulatory Visit: Payer: Self-pay

## 2018-12-15 DIAGNOSIS — Z131 Encounter for screening for diabetes mellitus: Secondary | ICD-10-CM

## 2018-12-15 DIAGNOSIS — Z6841 Body Mass Index (BMI) 40.0 and over, adult: Secondary | ICD-10-CM

## 2018-12-15 DIAGNOSIS — Z0000 Encounter for general adult medical examination without abnormal findings: Secondary | ICD-10-CM

## 2018-12-15 DIAGNOSIS — E162 Hypoglycemia, unspecified: Secondary | ICD-10-CM

## 2018-12-16 LAB — COMPREHENSIVE METABOLIC PANEL
ALT: 19 IU/L (ref 0–32)
AST: 17 IU/L (ref 0–40)
Albumin/Globulin Ratio: 1.4 (ref 1.2–2.2)
Albumin: 4 g/dL (ref 3.8–4.8)
Alkaline Phosphatase: 80 IU/L (ref 39–117)
BUN/Creatinine Ratio: 10 (ref 9–23)
BUN: 7 mg/dL (ref 6–20)
Bilirubin Total: 0.6 mg/dL (ref 0.0–1.2)
CO2: 20 mmol/L (ref 20–29)
Calcium: 9 mg/dL (ref 8.7–10.2)
Chloride: 106 mmol/L (ref 96–106)
Creatinine, Ser: 0.72 mg/dL (ref 0.57–1.00)
GFR calc Af Amer: 127 mL/min/{1.73_m2} (ref >59)
GFR calc non Af Amer: 110 mL/min/{1.73_m2} (ref >59)
Globulin, Total: 2.9 g/dL (ref 1.5–4.5)
Glucose: 83 mg/dL (ref 65–99)
Potassium: 3.9 mmol/L (ref 3.5–5.2)
Sodium: 142 mmol/L (ref 134–144)
Total Protein: 6.9 g/dL (ref 6.0–8.5)

## 2018-12-16 LAB — INSULIN, RANDOM: INSULIN: 8.1 u[IU]/mL (ref 2.6–24.9)

## 2018-12-16 LAB — HEMOGLOBIN A1C
Est. average glucose Bld gHb Est-mCnc: 108 mg/dL
Hgb A1c MFr Bld: 5.4 % (ref 4.8–5.6)

## 2019-05-08 NOTE — Progress Notes (Signed)
Patient, No Pcp Per   Chief Complaint  Patient presents with  . Vaginal Discharge    sour odor, no itchiness or irritation x 1 month    HPI:      [name] is a 33 y.o. G0P0000 who LMP was No LMP recorded. (Menstrual status: IUD)., presents today for increased d/c, with odor, without irritation for past month. Treated for yeast vag about 2 wks ago with monistat-7 with some improvement. No prior abx use, no soap/detergent change. No urin sx, LBP, pelvic pain, fevers. Hx of BV and GBS in past.   Kyleena placed 4/18, doing well. Amenorrheic. No new sex partners.   Last annual 2/19, has appt 12/20.  Patient Active Problem List   Diagnosis Date Noted  . Decreased libido 05/29/2018  . Anxiety and depression 05/29/2018    Past Surgical History:  Procedure Laterality Date  . COLPOSCOPY  2009/2010   no bx done  . INTRAUTERINE DEVICE (IUD) INSERTION  10/24/2013    Family History  Problem Relation Age of Onset  . Cancer Mother        cervical  . Hyperlipidemia Mother   . Hypertension Mother   . Hyperlipidemia Father   . Diabetes Maternal Grandmother   . Heart disease Maternal Grandfather     Social History   Socioeconomic History  . Marital status: Single    Spouse name: Not on file  . Number of children: Not on file  . Years of education: Not on file  . Highest education level: Not on file  Occupational History  . Not on file  Social Needs  . Financial resource strain: Not on file  . Food insecurity    Worry: Not on file    Inability: Not on file  . Transportation needs    Medical: Not on file    Non-medical: Not on file  Tobacco Use  . Smoking status: Never Smoker  . Smokeless tobacco: Never Used  Substance and Sexual Activity  . Alcohol use: Yes    Comment: socially  . Drug use: No  . Sexual activity: Yes    Birth control/protection: I.U.D.    Comment: Kyleena  Lifestyle  . Physical activity    Days per week: 4 days    Minutes per session:  60 min  . Stress: To some extent  Relationships  . Social connections    Talks on phone: More than three times a week    Gets together: More than three times a week    Attends religious service: Never    Active member of club or organization: No    Attends meetings of clubs or organizations: Never    Relationship status: Living with partner  . Intimate partner violence    Fear of current or ex partner: No    Emotionally abused: No    Physically abused: No    Forced sexual activity: No  Other Topics Concern  . Not on file  Social History Narrative  . Not on file    Outpatient Medications Prior to Visit  Medication Sig Dispense Refill  . ibuprofen (ADVIL,MOTRIN) 800 MG tablet     . Levonorgestrel (KYLEENA) 19.5 MG IUD 1 Device by Intrauterine route.    Marland Kitchen PARoxetine (PAXIL) 10 MG tablet Take 1/2 tab daily for 6 days, then 1 tab daily 30 tablet 1   No facility-administered medications prior to visit.       ROS:  Review of Systems  Constitutional: Positive  for fatigue. Negative for fever.  Gastrointestinal: Negative for blood in stool, constipation, diarrhea, nausea and vomiting.  Genitourinary: Positive for vaginal discharge. Negative for dyspareunia, dysuria, flank pain, frequency, hematuria, urgency, vaginal bleeding and vaginal pain.  Musculoskeletal: Negative for back pain.  Skin: Negative for rash.    OBJECTIVE:   Vitals:  BP 126/84   Ht 5\' 3"  (1.6 m)   Wt (!) 330 lb (149.7 kg)   BMI 58.46 kg/m   Physical Exam Vitals signs reviewed.  Constitutional:      Appearance: She is well-developed.  Neck:     Musculoskeletal: Normal range of motion.  Pulmonary:     Effort: Pulmonary effort is normal.  Genitourinary:    General: Normal vulva.     Pubic Area: No rash.      Labia:        Right: No rash, tenderness or lesion.        Left: No rash, tenderness or lesion.      Vagina: Vaginal discharge present. No erythema or tenderness.     Cervix: Normal.      Uterus: Normal. Not enlarged and not tender.      Adnexa: Right adnexa normal and left adnexa normal.       Right: No mass or tenderness.         Left: No mass or tenderness.    Musculoskeletal: Normal range of motion.  Skin:    General: Skin is warm and dry.  Neurological:     General: No focal deficit present.     Mental Status: She is alert and oriented to person, place, and time.  Psychiatric:        Mood and Affect: Mood normal.        Behavior: Behavior normal.        Thought Content: Thought content normal.        Judgment: Judgment normal.     Results: Results for orders placed or performed in visit on 05/09/19 (from the past 24 hour(s))  POCT Wet Prep with KOH     Status: Abnormal   Collection Time: 05/09/19 10:12 AM  Result Value Ref Range   Trichomonas, UA Negative    Clue Cells Wet Prep HPF POC pos    Epithelial Wet Prep HPF POC     Yeast Wet Prep HPF POC neg    Bacteria Wet Prep HPF POC     RBC Wet Prep HPF POC     WBC Wet Prep HPF POC     KOH Prep POC Negative Negative    Assessment/Plan: Bacterial vaginosis - Plan: POCT Wet Prep with KOH, metroNIDAZOLE (METROGEL) 0.75 % vaginal gel; Pos sx/wet prep. Rx metrogel. Add probiotics. F/u prn. Will RF if sx recur.   Meds ordered this encounter  Medications  . metroNIDAZOLE (METROGEL) 0.75 % vaginal gel    Sig: Place 1 Applicatorful vaginally at bedtime for 5 days.    Dispense:  50 g    Refill:  0    Order Specific Question:   Supervising Provider    Answer:   Gae Dry [[id number]]      Return if symptoms worsen or fail to improve.  Alicia B. Copland, PA-C 05/09/2019 10:12 AM

## 2019-05-09 ENCOUNTER — Encounter: Payer: Self-pay | Admitting: Obstetrics and Gynecology

## 2019-05-09 ENCOUNTER — Other Ambulatory Visit: Payer: Self-pay

## 2019-05-09 ENCOUNTER — Ambulatory Visit (INDEPENDENT_AMBULATORY_CARE_PROVIDER_SITE_OTHER): Payer: PRIVATE HEALTH INSURANCE | Admitting: Obstetrics and Gynecology

## 2019-05-09 VITALS — BP 126/84 | Ht 63.0 in | Wt 330.0 lb

## 2019-05-09 DIAGNOSIS — B9689 Other specified bacterial agents as the cause of diseases classified elsewhere: Secondary | ICD-10-CM | POA: Diagnosis not present

## 2019-05-09 DIAGNOSIS — N760 Acute vaginitis: Secondary | ICD-10-CM

## 2019-05-09 LAB — POCT WET PREP WITH KOH
Clue Cells Wet Prep HPF POC: POSITIVE
KOH Prep POC: NEGATIVE
Trichomonas, UA: NEGATIVE
Yeast Wet Prep HPF POC: NEGATIVE

## 2019-05-09 MED ORDER — METRONIDAZOLE 0.75 % VA GEL: 1.0000 | Freq: Every day | VAGINAL | 0 refills | Status: DC

## 2019-05-09 NOTE — Patient Instructions (Signed)
I value your feedback and entrusting us with your care. If you get a  patient survey, I would appreciate you taking the time to let us know about your experience today. Thank you! 

## 2019-05-31 ENCOUNTER — Encounter: Payer: Self-pay | Admitting: Obstetrics and Gynecology

## 2019-05-31 ENCOUNTER — Other Ambulatory Visit: Payer: Self-pay | Admitting: Obstetrics and Gynecology

## 2019-05-31 DIAGNOSIS — N760 Acute vaginitis: Secondary | ICD-10-CM

## 2019-05-31 MED ORDER — METRONIDAZOLE 0.75 % VA GEL: 1.0000 | Freq: Every day | VAGINAL | 0 refills | Status: AC

## 2019-05-31 NOTE — Progress Notes (Signed)
Rx RF metrogel for recurrent BV 

## 2019-06-05 NOTE — Progress Notes (Signed)
PCP:  Patient, No Pcp Per   Chief Complaint  Patient presents with  . Gynecologic Exam     HPI:      [name] is a 33 y.o. G0P0000 who LMP was No LMP recorded. (Menstrual status: IUD)., presents today for her annual examination.  Her menses are infrequent spotting/cramping for 1-2 days with IUD. No dyspareunia. Liked skyla in past better.  Sex activity: single partner, contraception - IUD. Kyleena placed 10/18/16 Last Pap: 08/04/17  Results were: no abnormalities /neg HPV DNA Hx of STDs: none  BV 11/20 treated with metrogel, again 12/20 with recurrence with sx relief.   There is no FH of breast cancer. There is no FH of ovarian cancer. The patient does do self-breast exams.  Tobacco use: The patient denies current or previous tobacco use. Alcohol use: social drinker No drug use.  Exercise: moderately active  She does get adequate calcium but not Vitamin D in her diet.  Trying to lose wt. Considering conception in near future. Trying to get to BMI<50 for [facility] delivery.    Past Medical History:  Diagnosis Date  . Abnormal Pap smear of cervix 05/28/2008;01/29/08;11/27/2012  . Arthritis   . Bacterial vaginosis   . Condyloma   . Degenerative disc disease, lumbar 06/2010  . Depression 2013  . History of Papanicolaou smear of cervix 04/26/2014   -/-; 04/29/2015 neg  . 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 COLPOSCOPY  2009/2010   no bx done  . INTRAUTERINE DEVICE (IUD) INSERTION  10/24/2013    Family History  Problem Relation Age of Onset  . Cancer Mother        cervical  . Hyperlipidemia Mother   . Hypertension Mother   . Hyperlipidemia Father   . Diabetes Maternal Grandmother   . Heart disease Maternal Grandfather     Social History   Socioeconomic History  . Marital status: Single     Spouse name: Not on file  . Number of children: Not on file  . Years of education: Not on file  . Highest education level: Not on file  Occupational History  . Not on file  Social Needs  . Financial resource strain: Not on file  . Food insecurity    Worry: Not on file    Inability: Not on file  . Transportation needs    Medical: Not on file    Non-medical: Not on file  Tobacco Use  . Smoking status: Never Smoker  . Smokeless tobacco: Never Used  Substance and Sexual Activity  . Alcohol use: Yes    Comment: socially  . Drug use: No  . Sexual activity: Yes    Birth control/protection: I.U.D.    Comment: Kyleena  Lifestyle  . Physical activity    Days per week: 4 days    Minutes per session: 60 min  . Stress: To some extent  Relationships  . Social connections    Talks on phone: More than three times a week    Gets together: More than three times a week    Attends religious service: Never    Active member of club or organization: No    Attends meetings of clubs or organizations: Never    Relationship status: Living with partner  . Intimate partner violence    Fear of  current or ex partner: No    Emotionally abused: No    Physically abused: No    Forced sexual activity: No  Other Topics Concern  . Not on file  Social History Narrative  . Not on file    Current Meds  Medication Sig  . ibuprofen (ADVIL,MOTRIN) 800 MG tablet   . Levonorgestrel (KYLEENA) 19.5 MG IUD 1 Device by Intrauterine route.     ROS:  Review of Systems  Constitutional: Negative for fatigue, fever and unexpected weight change.  Respiratory: Negative for cough, shortness of breath and wheezing.   Cardiovascular: Negative for chest pain, palpitations and leg swelling.  Gastrointestinal: Negative for blood in stool, constipation, diarrhea, nausea and vomiting.  Endocrine: Negative for cold intolerance, heat intolerance and polyuria.  Genitourinary: Negative for dyspareunia, dysuria, flank  pain, frequency, genital sores, hematuria, menstrual problem, pelvic pain, urgency, vaginal bleeding, vaginal discharge and vaginal pain.  Musculoskeletal: Negative for back pain, joint swelling and myalgias.  Skin: Negative for rash.  Neurological: Negative for dizziness, syncope, light-headedness, numbness and headaches.  Hematological: Negative for adenopathy.  Psychiatric/Behavioral: Negative for agitation, confusion, dysphoric mood, hallucinations, sleep disturbance and suicidal ideas. The patient is not nervous/anxious.      Objective: BP 118/84   Ht  (1.6 m)   Wt (!) 315 lb (142.9 kg)   BMI 55.80 kg/m    Physical Exam Constitutional:      Appearance: She is well-developed.  Genitourinary:     Vulva, vagina, uterus, right adnexa and left adnexa normal.     No vulval lesion or tenderness noted.     No vaginal discharge, erythema or tenderness.     No cervical motion tenderness or polyp.     IUD strings visualized.     Uterus is not enlarged or tender.     No right or left adnexal mass present.     Right adnexa not tender.     Left adnexa not tender.  Neck:     Musculoskeletal: Normal range of motion.     Thyroid: No thyromegaly.  Cardiovascular:     Rate and Rhythm: Normal rate and regular rhythm.     Heart sounds: Normal heart sounds. No murmur.  Pulmonary:     Effort: Pulmonary effort is normal.     Breath sounds: Normal breath sounds.  Chest:     Breasts:        Right: No mass, nipple discharge, skin change or tenderness.        Left: No mass, nipple discharge, skin change or tenderness.  Abdominal:     Palpations: Abdomen is soft.     Tenderness: There is no abdominal tenderness. There is no guarding.  Musculoskeletal: Normal range of motion.  Neurological:     General: No focal deficit present.     Mental Status: She is alert and oriented to person, place, and time.     Cranial Nerves: No cranial nerve deficit.  Skin:    General: Skin is warm and  dry.  Psychiatric:        Mood and Affect: Mood normal.        Behavior: Behavior normal.        Thought Content: Thought content normal.        Judgment: Judgment normal.  Vitals signs reviewed.     Assessment/Plan: Encounter for annual routine gynecological examination  Encounter for routine checking of intrauterine contraceptive device (IUD); IUD in place.   Bacterial vaginosis--will change to clindamycin  if sx recur. F/u prn.             GYN counsel adequate intake of calcium and vitamin D, diet and exercise     F/U  Return in about 1 year (around 06/05/2020).   B. , PA-C 06/06/2019 12:03 PM

## 2019-06-06 ENCOUNTER — Encounter: Payer: Self-pay | Admitting: Obstetrics and Gynecology

## 2019-06-06 ENCOUNTER — Other Ambulatory Visit: Payer: Self-pay

## 2019-06-06 ENCOUNTER — Ambulatory Visit (INDEPENDENT_AMBULATORY_CARE_PROVIDER_SITE_OTHER): Payer: PRIVATE HEALTH INSURANCE | Admitting: Obstetrics and Gynecology

## 2019-06-06 VITALS — BP 118/84 | Ht 63.0 in | Wt 315.0 lb

## 2019-06-06 DIAGNOSIS — Z01419 Encounter for gynecological examination (general) (routine) without abnormal findings: Secondary | ICD-10-CM | POA: Diagnosis not present

## 2019-06-06 DIAGNOSIS — Z30431 Encounter for routine checking of intrauterine contraceptive device: Secondary | ICD-10-CM

## 2019-06-06 DIAGNOSIS — N760 Acute vaginitis: Secondary | ICD-10-CM

## 2019-06-06 DIAGNOSIS — B9689 Other specified bacterial agents as the cause of diseases classified elsewhere: Secondary | ICD-10-CM

## 2019-06-06 NOTE — Patient Instructions (Signed)
I value your feedback and entrusting us with your care. If you get a [facility] patient survey, I would appreciate you taking the time to let us know about your experience today. Thank you! 

## 2019-12-05 ENCOUNTER — Encounter: Payer: Self-pay | Admitting: Obstetrics and Gynecology

## 2020-03-21 ENCOUNTER — Telehealth: Payer: Self-pay

## 2020-03-21 NOTE — Telephone Encounter (Signed)
Pt requested appt via mychart, per [facility], I have called pt twice to get more info on issues going on to get appt. Both times called no answer, LVMTRC.

## 2020-07-16 ENCOUNTER — Ambulatory Visit: Payer: PRIVATE HEALTH INSURANCE | Admitting: Obstetrics and Gynecology

## 2020-08-18 ENCOUNTER — Ambulatory Visit (INDEPENDENT_AMBULATORY_CARE_PROVIDER_SITE_OTHER): Payer: Managed Care, Other (non HMO) | Admitting: Obstetrics and Gynecology

## 2020-08-18 ENCOUNTER — Other Ambulatory Visit (HOSPITAL_COMMUNITY)
Admission: RE | Admit: 2020-08-18 | Discharge: 2020-08-18 | Disposition: A | Discharge status: Home / Self Care | Payer: Managed Care, Other (non HMO) | Source: Ambulatory Visit | Attending: Obstetrics and Gynecology | Admitting: Obstetrics and Gynecology

## 2020-08-18 ENCOUNTER — Encounter: Payer: Self-pay | Admitting: Obstetrics and Gynecology

## 2020-08-18 ENCOUNTER — Other Ambulatory Visit: Payer: Self-pay

## 2020-08-18 VITALS — BP 126/80 | Ht 63.0 in | Wt 310.0 lb

## 2020-08-18 DIAGNOSIS — Z01419 Encounter for gynecological examination (general) (routine) without abnormal findings: Secondary | ICD-10-CM | POA: Diagnosis not present

## 2020-08-18 DIAGNOSIS — Z30432 Encounter for removal of intrauterine contraceptive device: Secondary | ICD-10-CM

## 2020-08-18 DIAGNOSIS — Z3169 Encounter for other general counseling and advice on procreation: Secondary | ICD-10-CM

## 2020-08-18 DIAGNOSIS — Z87410 Personal history of cervical dysplasia: Secondary | ICD-10-CM

## 2020-08-18 DIAGNOSIS — Z1151 Encounter for screening for human papillomavirus (HPV): Secondary | ICD-10-CM | POA: Insufficient documentation

## 2020-08-18 DIAGNOSIS — Z124 Encounter for screening for malignant neoplasm of cervix: Secondary | ICD-10-CM

## 2020-08-18 NOTE — Progress Notes (Signed)
PCP:  Patient, No Pcp Per   Chief Complaint  Patient presents with  . Gynecologic Exam    IUD removal, plans to conceive     HPI:      [name] is a 35 y.o. G0P0000 who LMP was No LMP recorded. (Menstrual status: IUD)., presents today for her annual examination.  Her menses are now absent with IUD. Has mild monthly cramping. Hx of irreg menses in past but has been on pills, depo and now IUD for many yrs. Not sure of how periods will be without IUD.  Sex activity: single partner, contraception - IUD. Kyleena placed 10/18/16. Wants removed for conception. Not taking PNVs. Last Pap: 08/04/17  Results were: no abnormalities /neg HPV DNA Hx of STDs: HPV/abnormal paps 2009/2010  There is no FH of breast cancer. There is no FH of ovarian cancer. The patient does do self-breast exams.  Tobacco use: The patient denies current or previous tobacco use. Alcohol use: social drinker No drug use.  Exercise: moderately active  She does get adequate calcium but not Vitamin D in her diet.  Past Medical History:  Diagnosis Date  . Abnormal Pap smear of cervix 05/28/2008;01/29/08;11/27/2012  . Arthritis   . Bacterial vaginosis   . Condyloma   . Degenerative disc disease, lumbar 06/2010  . Depression 2013  . History of Papanicolaou smear of cervix 04/26/2014   -/-; 04/29/2015 neg  . 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 COLPOSCOPY  2009/2010   no bx done  . INTRAUTERINE DEVICE (IUD) INSERTION  10/24/2013    Family History  Problem Relation Age of Onset  . Cancer Mother        cervical  . Hyperlipidemia Mother   . Hypertension Mother   . Hyperlipidemia Father   . Diabetes Maternal Grandmother   . Heart disease Maternal Grandfather     Social History   Socioeconomic History  . Marital status: Married     Spouse name: Not on file  . Number of children: Not on file  . Years of education: Not on file  . Highest education level: Not on file  Occupational History  . Not on file  Tobacco Use  . Smoking status: Never Smoker  . Smokeless tobacco: Never Used  Vaping Use  . Vaping Use: Never used  Substance and Sexual Activity  . Alcohol use: Yes    Comment: socially  . Drug use: No  . Sexual activity: Yes    Birth control/protection: I.U.D.    Comment: Kyleena  Other Topics Concern  . Not on file  Social History Narrative  . Not on file   Social Determinants of Health   Financial Resource Strain: Not on file  Food Insecurity: Not on file  Transportation Needs: Not on file  Physical Activity: Not on file  Stress: Not on file  Social Connections: Not on file  Intimate Partner Violence: Not on file    Current Meds  Medication Sig  . [DISCONTINUED] levonorgestrel (KYLEENA) 19.5 MG IUD 1 Device by Intrauterine route.     ROS:  Review of Systems  Constitutional: Negative for fatigue, fever and unexpected weight change.  Respiratory: Negative for cough, shortness of breath and wheezing.   Cardiovascular: Negative for chest pain, palpitations and leg swelling.  Gastrointestinal: Negative for blood in stool,  constipation, diarrhea, nausea and vomiting.  Endocrine: Negative for cold intolerance, heat intolerance and polyuria.  Genitourinary: Negative for dyspareunia, dysuria, flank pain, frequency, genital sores, hematuria, menstrual problem, pelvic pain, urgency, vaginal bleeding, vaginal discharge and vaginal pain.  Musculoskeletal: Negative for back pain, joint swelling and myalgias.  Skin: Negative for rash.  Neurological: Negative for dizziness, syncope, light-headedness, numbness and headaches.  Hematological: Negative for adenopathy.  Psychiatric/Behavioral: Positive for agitation and dysphoric mood. Negative for confusion, hallucinations, sleep disturbance and suicidal  ideas. The patient is not nervous/anxious.      Objective: BP 126/80   Ht 5\' 3"  (1.6 m)   Wt (!) 310 lb (140.6 kg)   BMI 54.91 kg/m    Physical Exam Constitutional:      Appearance: She is well-developed.  Genitourinary:     Vulva normal.     Right Labia: No rash, tenderness or lesions.    Left Labia: No tenderness, lesions or rash.    No vaginal discharge, erythema or tenderness.      Right Adnexa: not tender and no mass present.    Left Adnexa: not tender and no mass present.    No cervical motion tenderness, friability or polyp.     IUD strings visualized.     Uterus is not enlarged or tender.  Breasts:     Right: No mass, nipple discharge, skin change or tenderness.     Left: No mass, nipple discharge, skin change or tenderness.    Neck:     Thyroid: No thyromegaly.  Cardiovascular:     Rate and Rhythm: Normal rate and regular rhythm.     Heart sounds: Normal heart sounds. No murmur heard.   Pulmonary:     Effort: Pulmonary effort is normal.     Breath sounds: Normal breath sounds.  Abdominal:     Palpations: Abdomen is soft.     Tenderness: There is no abdominal tenderness. There is no guarding or rebound.  Musculoskeletal:        General: Normal range of motion.     Cervical back: Normal range of motion.  Lymphadenopathy:     Cervical: No cervical adenopathy.  Neurological:     General: No focal deficit present.     Mental Status: She is alert and oriented to person, place, and time.     Cranial Nerves: No cranial nerve deficit.  Skin:    General: Skin is warm and dry.  Psychiatric:        Mood and Affect: Mood normal.        Behavior: Behavior normal.        Thought Content: Thought content normal.        Judgment: Judgment normal.  Vitals reviewed.    IUD Removal Strings of IUD identified and grasped.  IUD removed without problem with ring forceps.  Pt tolerated this well.  IUD noted to be intact.   Assessment/Plan: Encounter for annual  routine gynecological examination  Cervical cancer screening - Plan: Cytology - PAP  Screening for HPV (human papillomavirus) - Plan: Cytology - PAP  History of cervical dysplasia - Plan: Cytology - PAP  Encounter for IUD removal--IUD removed.   Pre-conception counseling--start PNVs, f/u prn [facility]. Hx of irreg cycles in past but has monthly cramping with IUD. Will have to see what cycles do off [facility] (has been on [facility] for many years)            GYN counsel adequate intake of calcium and vitamin D, diet  and exercise     F/U  Return in about 1 year (around 08/18/2021).  Alicia B. Copland, PA-C 08/18/2020 2:48 PM

## 2020-08-18 NOTE — Patient Instructions (Signed)
I value your feedback and you entrusting us with your care. If you get a [facility] patient survey, I would appreciate you taking the time to let us know about your experience today. Thank you! ? ? ?

## 2020-08-20 LAB — CYTOLOGY - PAP
Comment: NEGATIVE
Diagnosis: NEGATIVE
High risk HPV: NEGATIVE

## 2020-09-09 ENCOUNTER — Encounter: Payer: Self-pay | Admitting: Obstetrics and Gynecology

## 2021-03-03 ENCOUNTER — Telehealth: Payer: Self-pay

## 2021-03-03 NOTE — Telephone Encounter (Signed)
Pt calling; needs to make sure 7d monistat is okay to use in preg.  [phone]  Adv pt 7d monistat is okay to use and if not better to have it ck'd out at [facility] on the 16th.  Pt states the outside is itchy; no d/c.  Adv can apply to outside c finger.  Pt asked what to expect at [facility] appt.  Adv paperwork, lots of questions and an exam like an annual; dating u/s will be scheduled for another day; may or may not have lab work the same day as [facility].

## 2021-03-13 ENCOUNTER — Ambulatory Visit (INDEPENDENT_AMBULATORY_CARE_PROVIDER_SITE_OTHER): Payer: Managed Care, Other (non HMO) | Admitting: Obstetrics & Gynecology

## 2021-03-13 ENCOUNTER — Encounter: Payer: Self-pay | Admitting: Obstetrics & Gynecology

## 2021-03-13 ENCOUNTER — Other Ambulatory Visit (HOSPITAL_COMMUNITY)
Admission: RE | Admit: 2021-03-13 | Discharge: 2021-03-13 | Disposition: A | Discharge status: Home / Self Care | Payer: Managed Care, Other (non HMO) | Source: Ambulatory Visit | Attending: Obstetrics & Gynecology | Admitting: Obstetrics & Gynecology

## 2021-03-13 ENCOUNTER — Other Ambulatory Visit: Payer: Self-pay

## 2021-03-13 VITALS — BP 120/80 | Wt 317.0 lb

## 2021-03-13 DIAGNOSIS — O0990 Supervision of high risk pregnancy, unspecified, unspecified trimester: Secondary | ICD-10-CM | POA: Insufficient documentation

## 2021-03-13 DIAGNOSIS — Z3401 Encounter for supervision of normal first pregnancy, first trimester: Secondary | ICD-10-CM

## 2021-03-13 DIAGNOSIS — Z369 Encounter for antenatal screening, unspecified: Secondary | ICD-10-CM | POA: Diagnosis not present

## 2021-03-13 DIAGNOSIS — Z131 Encounter for screening for diabetes mellitus: Secondary | ICD-10-CM

## 2021-03-13 DIAGNOSIS — O0991 Supervision of high risk pregnancy, unspecified, first trimester: Secondary | ICD-10-CM

## 2021-03-13 DIAGNOSIS — O99211 Obesity complicating pregnancy, first trimester: Secondary | ICD-10-CM | POA: Insufficient documentation

## 2021-03-13 DIAGNOSIS — N926 Irregular menstruation, unspecified: Secondary | ICD-10-CM | POA: Diagnosis not present

## 2021-03-13 DIAGNOSIS — O09521 Supervision of elderly multigravida, first trimester: Secondary | ICD-10-CM

## 2021-03-13 LAB — POCT URINE PREGNANCY: Preg Test, Ur: POSITIVE — AB

## 2021-03-13 LAB — POCT URINALYSIS DIPSTICK OB
Glucose, UA: NEGATIVE
POC,PROTEIN,UA: NEGATIVE

## 2021-03-13 MED ORDER — DOXYLAMINE-PYRIDOXINE 10-10 MG PO TBEC: 2.0000 | DELAYED_RELEASE_TABLET | Freq: Every day | ORAL | 5 refills | Status: DC

## 2021-03-13 NOTE — Patient Instructions (Signed)
First Trimester of Pregnancy °The first trimester of pregnancy starts on the first day of your last menstrual period until the end of week 12. This is months 1 through 3 of pregnancy. A week after a sperm fertilizes an egg, the egg will implant into the wall of the uterus and begin to develop into a baby. By the end of 12 weeks, all the baby's organs will be formed and the baby will be 2-3 inches in size. °Body changes during your first trimester °Your body goes through many changes during pregnancy. The changes vary and generally return to normal after your baby is born. °Physical changes °You may gain or lose weight. °Your breasts may begin to grow larger and become tender. The tissue that surrounds your nipples (areola) may become darker. °Dark spots or blotches (chloasma or mask of pregnancy) may develop on your face. °You may have changes in your hair. These can include thickening or thinning of your hair or changes in texture. °Health changes °You may feel nauseous, and you may vomit. °You may have heartburn. °You may develop headaches. °You may develop constipation. °Your gums may bleed and may be sensitive to brushing and flossing. °Other changes °You may tire easily. °You may urinate more often. °Your menstrual periods will stop. °You may have a loss of appetite. °You may develop cravings for certain kinds of food. °You may have changes in your emotions from day to day. °You may have more vivid and strange dreams. °Follow these instructions at home: °Medicines °Follow your health care provider's instructions regarding medicine use. Specific medicines may be either safe or unsafe to take during pregnancy. Do not take any medicines unless told to by your health care provider. °Take a prenatal vitamin that contains at least 600 micrograms (mcg) of folic acid. °Eating and drinking °Eat a healthy diet that includes fresh fruits and vegetables, whole grains, good sources of protein such as meat, eggs, or tofu,  and low-fat dairy products. °Avoid raw meat and unpasteurized juice, milk, and cheese. These carry germs that can harm you and your baby. °If you feel nauseous or you vomit: °Eat 4 or 5 small meals a day instead of 3 large meals. °Try eating a few soda crackers. °Drink liquids between meals instead of during meals. °You may need to take these actions to prevent or treat constipation: °Drink enough fluid to keep your urine pale yellow. °Eat foods that are high in fiber, such as beans, whole grains, and fresh fruits and vegetables. °Limit foods that are high in fat and processed sugars, such as fried or sweet foods. °Activity °Exercise only as directed by your health care provider. Most people can continue their usual exercise routine during pregnancy. Try to exercise for 30 minutes at least 5 days a week. °Stop exercising if you develop pain or cramping in the lower abdomen or lower back. °Avoid exercising if it is very hot or humid or if you are at high altitude. °Avoid heavy lifting. °If you choose to, you may have sex unless your health care provider tells you not to. °Relieving pain and discomfort °Wear a good support bra to relieve breast tenderness. °Rest with your legs elevated if you have leg cramps or low back pain. °If you develop bulging veins (varicose veins) in your legs: °Wear support hose as told by your health care provider. °Elevate your feet for 15 minutes, 3-4 times a day. °Limit salt in your diet. °Safety °Wear your seat belt at all times when driving   or riding in a car. °Talk with your health care provider if someone is verbally or physically abusive to you. °Talk with your health care provider if you are feeling sad or have thoughts of hurting yourself. °Lifestyle °Do not use hot tubs, steam rooms, or saunas. °Do not douche. Do not use tampons or scented sanitary pads. °Do not use herbal remedies, alcohol, illegal drugs, or medicines that are not approved by your health care provider. Chemicals  in these products can harm your baby. °Do not use any products that contain nicotine or tobacco, such as cigarettes, e-cigarettes, and chewing tobacco. If you need help quitting, ask your health care provider. °Avoid cat litter boxes and soil used by cats. These carry germs that can cause birth defects in the baby and possibly loss of the unborn baby (fetus) by miscarriage or stillbirth. °General instructions °During routine prenatal visits in the first trimester, your health care provider will do a physical exam, perform necessary tests, and ask you how things are going. Keep all follow-up visits. This is important. °Ask for help if you have counseling or nutritional needs during pregnancy. Your health care provider can offer advice or refer you to specialists for help with various needs. °Schedule a dentist appointment. At home, brush your teeth with a soft toothbrush. Floss gently. °Write down your questions. Take them to your prenatal visits. °Where to find more information °American Pregnancy Association: americanpregnancy.org °American College of Obstetricians and Gynecologists: acog.org/en/Womens%20Health/Pregnancy °Office on Women's Health: womenshealth.gov/pregnancy °Contact a health care provider if you have: °Dizziness. °A fever. °Mild pelvic cramps, pelvic pressure, or nagging pain in the abdominal area. °Nausea, vomiting, or diarrhea that lasts for 24 hours or longer. °A bad-smelling vaginal discharge. °Pain when you urinate. °Known exposure to a contagious illness, such as chickenpox, measles, Zika virus, HIV, or hepatitis. °Get help right away if you have: °Spotting or bleeding from your vagina. °Severe abdominal cramping or pain. °Shortness of breath or chest pain. °Any kind of trauma, such as from a fall or a car crash. °New or increased pain, swelling, or redness in an arm or leg. °Summary °The first trimester of pregnancy starts on the first day of your last menstrual period until the end of week  12 (months 1 through 3). °Eating 4 or 5 small meals a day rather than 3 large meals may help to relieve nausea and vomiting. °Do not use any products that contain nicotine or tobacco, such as cigarettes, e-cigarettes, and chewing tobacco. If you need help quitting, ask your health care provider. °Keep all follow-up visits. This is important. °This information is not intended to replace advice given to you by your health care provider. Make sure you discuss any questions you have with your health care provider. °Document Revised: 11/21/2019 Document Reviewed: 09/27/2019 °Elsevier Patient Education © 2022 [employer] Inc. ° °

## 2021-03-13 NOTE — Progress Notes (Signed)
03/13/2021   Chief Complaint: Missed period  History of Present Illness: Kristina Mccall is a 35 y.o. G1P0000 [redacted]w[redacted]d based on Patient's last menstrual period was 01/13/2021. with an Estimated Date of Delivery: 10/20/21, with the above CC.   Her periods were: regular periods every 28 days She was using no method when she conceived.  She has Positive signs or symptoms of nausea/vomiting of pregnancy. She has Negative signs or symptoms of miscarriage or preterm labor She identifies Negative Zika risk factors for her and her partner On any different medications around the time she conceived/early pregnancy: No  History of varicella: Yes   ROS: A 12-point review of systems was performed and negative, except as stated in the above HPI.  OBGYN History: As per HPI. OB History  Gravida Para Term Preterm AB Living  1 0 0 0 0 0  SAB IAB Ectopic Multiple Live Births  0 0 0 0 0    # Outcome Date GA Lbr Len/2nd Weight Sex Delivery Anes PTL Lv  1 Current             Any issues with any prior pregnancies: no Any prior children are healthy, doing well, without any problems or issues: no History of pap smears: Yes. Last pap smear 2022. 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  She denies any history of mental retardation, birth defects or genetic disorders in her or the FOB's history  Social History:  Social History   Socioeconomic History   Marital status: Married    Spouse name: Not on file   Number of children: Not on file   Years of education: Not on file   Highest education level: Not on file  Occupational History   Not on file  Tobacco Use   Smoking status: Never   Smokeless tobacco: Never  Vaping Use   Vaping Use: Never used  Substance and Sexual Activity   Alcohol use: Yes    Comment: socially   Drug use: No   Sexual activity: Yes    Birth control/protection: I.U.D.    Comment: Kyleena  Other Topics Concern   Not on file  Social History Narrative   Not on file   Social Determinants of Health   Financial Resource Strain: Not on file  Food Insecurity: Not on file  Transportation Needs: Not on file  Physical Activity: Not on file  Stress: Not on file  Social Connections: Not on file  Intimate Partner Violence: Not on file   Any pets in the household: no  Allergy: Allergies  Allergen Reactions   Amoxicillin Nausea And Vomiting    Other reaction(s): GI intolerance   Ciprofloxacin Swelling    Pitting edema    Doxycycline Nausea And Vomiting   Hydrocodone Hives   Methylprednisolone Sodium Succ Hives   Other Hives    methylprednisone   Penicillins Nausea And Vomiting   Latex Rash    Current Outpatient Medications:  Current Outpatient Medications:    Doxylamine-Pyridoxine (DICLEGIS) 10-10 MG TBEC, Take 2 tablets by mouth at bedtime. If symptoms persist, add one tablet in the morning and one in the afternoon, Disp: 100 tablet, Rfl: 5   Physical Exam:   BP 120/80   Wt (!) 317 lb (143.8 kg)   LMP 01/13/2021    BMI 56.15 kg/m  Body mass index is 56.15 kg/m. Constitutional: Well nourished, well developed female in no acute distress.  Neck:  Supple, normal appearance, and no thyromegaly  Cardiovascular: S1, S2 normal, no murmur, rub or gallop, regular rate and rhythm Respiratory:  Clear to auscultation bilateral. Normal respiratory effort Abdomen: positive bowel sounds and no masses, hernias; diffusely non tender to palpation, non distended Breasts: breasts appear normal, no suspicious masses, no skin or nipple changes or axillary nodes. Neuro/Psych:  Normal mood and affect.  Skin:  Warm and dry.  Lymphatic:  No inguinal lymphadenopathy.   Pelvic exam: is limited by body habitus EGBUS: within normal limits, Vagina: within normal limits and with no blood in the vault, Cervix: normal appearing cervix without discharge or lesions, closed/long/high, Uterus:  enlarged: 8 weeks, and Adnexa:  normal adnexa  Assessment: Kristina Mccall is a 35 y.o. G1P0000 [redacted]w[redacted]d based on Patient's last menstrual period was 01/13/2021. with an Estimated Date of Delivery: 10/20/21,  for prenatal care.  Plan:  1) Avoid alcoholic beverages. 2) Patient encouraged not to smoke.  3) Discontinue the use of all non-medicinal drugs and chemicals.  4) Take prenatal vitamins daily.  5) Seatbelt use advised 6) Nutrition, food safety (fish, cheese advisories, and high nitrite foods) and exercise discussed. 7) Hospital and practice style delivering at [facility] discussed  8) Patient is asked about travel to areas at risk for the Zika virus, and counseled to avoid travel and exposure to mosquitoes or sexual partners who may have themselves been exposed to the virus. Testing is discussed, and will be ordered as appropriate.  9) Childbirth classes at [facility] advised 10) Genetic Screening, such as with 1st Trimester Screening, cell free fetal DNA, AFP testing, and Ultrasound, as well as with amniocentesis and CVS as appropriate, is discussed with  patient. She plans to have genetic testing this pregnancy. 11) [place] soon 12) Glucola and PNL and NIPT nv, after [place] confirms EDC 13) risks of AMA discussed 14) risks of obesity discussed Plan anes consult, early glucola, growth [place]  Problem list reviewed and updated.  Annamarie Major, MD, [facility], [facility] 03/13/2021  11:45 AM

## 2021-03-17 LAB — GC/CHLAMYDIA PROBE AMP (~~LOC~~) NOT AT ARMC
Chlamydia: NEGATIVE
Comment: NEGATIVE
Comment: NORMAL
Neisseria Gonorrhea: NEGATIVE

## 2021-03-18 LAB — URINE CULTURE

## 2021-03-26 ENCOUNTER — Other Ambulatory Visit: Payer: Self-pay

## 2021-03-26 ENCOUNTER — Encounter: Payer: Self-pay | Admitting: Obstetrics & Gynecology

## 2021-03-26 ENCOUNTER — Other Ambulatory Visit: Payer: Managed Care, Other (non HMO)

## 2021-03-26 ENCOUNTER — Ambulatory Visit (INDEPENDENT_AMBULATORY_CARE_PROVIDER_SITE_OTHER): Payer: Managed Care, Other (non HMO) | Admitting: Obstetrics & Gynecology

## 2021-03-26 VITALS — BP 130/80 | Wt 315.0 lb

## 2021-03-26 DIAGNOSIS — O0991 Supervision of high risk pregnancy, unspecified, first trimester: Secondary | ICD-10-CM

## 2021-03-26 DIAGNOSIS — O99211 Obesity complicating pregnancy, first trimester: Secondary | ICD-10-CM

## 2021-03-26 DIAGNOSIS — Z3A10 10 weeks gestation of pregnancy: Secondary | ICD-10-CM

## 2021-03-26 DIAGNOSIS — O09521 Supervision of elderly multigravida, first trimester: Secondary | ICD-10-CM

## 2021-03-26 DIAGNOSIS — N926 Irregular menstruation, unspecified: Secondary | ICD-10-CM | POA: Diagnosis not present

## 2021-03-26 NOTE — Progress Notes (Signed)
  Subjective  Nausea & breast T No pain or bleeding  Objective  BP 130/80   Wt (!) 315 lb (142.9 kg)   LMP 01/13/2021   BMI 55.80 kg/m  General: NAD Pumonary: no increased work of breathing Abdomen: gravid, non-tender Extremities: no edema Psychiatric: mood appropriate, affect full  Assessment  35 y.o. G1P0000 at [redacted]w[redacted]d by  10/20/2021, by Last Menstrual Period presenting for routine prenatal visit  Plan   Problem List Items Addressed This Visit      Other   Obesity affecting pregnancy in first trimester   High-risk pregnancy, first trimester - Primary   Multigravida of advanced maternal age in first trimester   Relevant Orders   MaterniT21 PLUS Core+SCA   RPR+Rh+ABO+Rub Ab+Ab Scr+CB...   Hepatitis C antibody  Other Visit Diagnoses    [redacted] weeks gestation of pregnancy        See [place] report.  Change EDC.  Now 8 2/7 weeks. Plan labs w glucola and NIPT in 2 weeks PNV, ASA Anes consult planned, discussed as to why   Annamarie Major, MD, [facility], [facility] 03/26/2021  3:31 PM

## 2021-03-26 NOTE — Patient Instructions (Signed)

## 2021-03-26 NOTE — Progress Notes (Signed)
ULTRASOUND REPORT  Location: [facility] Date of Service: 03/26/2021   Indications:Unsure LMP Findings:  Mason Jim intrauterine pregnancy is visualized with a CRL consistent with [redacted]w[redacted]d gestation, giving an (U/S) EDD of 11/03/2021. The (U/S) EDD is not consistent with the clinically established EDD of 10/20/21.  FHR: 170 BPM CRL measurement: 17.8 mm Yolk sac is visualized and appears normal.  4.7 mm. Amnion: visualized and appears normal   Right Ovary is normal in appearance. Left Ovary is normal appearance. Corpus luteal cyst:  is not visualized Survey of the adnexa demonstrates no adnexal masses. There is no free peritoneal fluid in the cul de sac.  Impression: 1. [redacted]w[redacted]d Viable Singleton Intrauterine pregnancy by U/S. 2. (U/S) EDD is consistent with Clinically established EDD of 11/03/21.  Recommendations: 1.Clinical correlation with the patient's History and Physical Exam. 2. Change EDC  Letitia Libra, MD

## 2021-04-13 ENCOUNTER — Other Ambulatory Visit: Payer: Self-pay

## 2021-04-13 ENCOUNTER — Other Ambulatory Visit: Payer: Managed Care, Other (non HMO)

## 2021-04-13 DIAGNOSIS — Z131 Encounter for screening for diabetes mellitus: Secondary | ICD-10-CM

## 2021-04-13 DIAGNOSIS — O09521 Supervision of elderly multigravida, first trimester: Secondary | ICD-10-CM

## 2021-04-13 LAB — OB RESULTS CONSOLE VARICELLA ZOSTER ANTIBODY, IGG: Varicella: IMMUNE

## 2021-04-14 LAB — RPR+RH+ABO+RUB AB+AB SCR+CB...
Antibody Screen: NEGATIVE
HIV Screen 4th Generation wRfx: NONREACTIVE
Hematocrit: 39.7 % (ref 34.0–46.6)
Hemoglobin: 13.7 g/dL (ref 11.1–15.9)
Hepatitis B Surface Ag: NEGATIVE
MCH: 30.9 pg (ref 26.6–33.0)
MCHC: 34.5 g/dL (ref 31.5–35.7)
MCV: 90 fL (ref 79–97)
Platelets: 312 10*3/uL (ref 150–450)
RBC: 4.43 x10E6/uL (ref 3.77–5.28)
RDW: 12.1 % (ref 11.7–15.4)
RPR Ser Ql: NONREACTIVE
Rh Factor: POSITIVE
Rubella Antibodies, IGG: 1.26 index (ref >0.99)
Varicella zoster IgG: 922 index (ref >165)
WBC: 8.7 10*3/uL (ref 3.4–10.8)

## 2021-04-14 LAB — GLUCOSE, 1 HOUR GESTATIONAL: Gestational Diabetes Screen: 130 mg/dL (ref 70–139)

## 2021-04-14 LAB — HEPATITIS C ANTIBODY: Hep C Virus Ab: <0.1 s/co ratio (ref 0.0–0.9)

## 2021-04-15 ENCOUNTER — Other Ambulatory Visit: Payer: Managed Care, Other (non HMO)

## 2021-04-18 LAB — MATERNIT21 PLUS CORE+SCA
Fetal Fraction: 5
Monosomy X (Turner Syndrome): NOT DETECTED
Result (T21): NEGATIVE
Trisomy 13 (Patau syndrome): NEGATIVE
Trisomy 18 (Edwards syndrome): NEGATIVE
Trisomy 21 (Down syndrome): NEGATIVE
XXX (Triple X Syndrome): NOT DETECTED
XXY (Klinefelter Syndrome): NOT DETECTED
XYY (Jacobs Syndrome): NOT DETECTED

## 2021-04-18 LAB — SPECIMEN STATUS REPORT

## 2021-04-27 ENCOUNTER — Ambulatory Visit (INDEPENDENT_AMBULATORY_CARE_PROVIDER_SITE_OTHER): Payer: Managed Care, Other (non HMO) | Admitting: Obstetrics & Gynecology

## 2021-04-27 ENCOUNTER — Encounter: Payer: Self-pay | Admitting: Obstetrics & Gynecology

## 2021-04-27 ENCOUNTER — Other Ambulatory Visit: Payer: Self-pay

## 2021-04-27 VITALS — BP 130/80 | Wt 319.0 lb

## 2021-04-27 DIAGNOSIS — O09521 Supervision of elderly multigravida, first trimester: Secondary | ICD-10-CM

## 2021-04-27 DIAGNOSIS — O0991 Supervision of high risk pregnancy, unspecified, first trimester: Secondary | ICD-10-CM

## 2021-04-27 DIAGNOSIS — O99211 Obesity complicating pregnancy, first trimester: Secondary | ICD-10-CM

## 2021-04-27 DIAGNOSIS — Z3689 Encounter for other specified antenatal screening: Secondary | ICD-10-CM

## 2021-04-27 DIAGNOSIS — Z6841 Body Mass Index (BMI) 40.0 and over, adult: Secondary | ICD-10-CM | POA: Insufficient documentation

## 2021-04-27 LAB — POCT URINALYSIS DIPSTICK OB
Glucose, UA: NEGATIVE
POC,PROTEIN,UA: NEGATIVE

## 2021-04-27 NOTE — Progress Notes (Signed)
  Subjective  Pt has mild nausea Pt describes heart palpitations regularly Pt is concerned about weight and age this pregnancy  Objective  BP 130/80   Wt (!) 319 lb (144.7 kg)   LMP 01/13/2021   BMI 56.51 kg/m  General: NAD Pumonary: no increased work of breathing Abdomen: gravid, non-tender Extremities: no edema Psychiatric: mood appropriate, affect full  Assessment  35 y.o. G1P0000 at [redacted]w[redacted]d by  11/03/2021, by Ultrasound presenting for routine prenatal visit  Plan   Problem List Items Addressed This Visit      Other   Obesity affecting pregnancy in first trimester   High-risk pregnancy, first trimester - Primary   Relevant Orders   AMB Referral to Cardio Obstetrics   Multigravida of advanced maternal age in first trimester   BMI 50.0-59.9, adult (HCC)   Relevant Orders   [place] MFM OB DETAIL +14 WK   AMB Referral to Cardio Obstetrics  Other Visit Diagnoses    Screening, antenatal, for fetal anatomic survey       Relevant Orders   [place] MFM OB DETAIL +14 WK    PNV, ASA daily Plan Anes appt 11/9 Refer to Cardio Ob for heart palpitations and due to her risk factors Anat [place] in 7 weeks  Annamarie Major, MD, [facility], [facility] 04/27/2021  10:39 AM

## 2021-04-27 NOTE — Patient Instructions (Signed)

## 2021-04-27 NOTE — Addendum Note (Signed)
Addended by: Cornelius Moras D on: 04/27/2021 10:45 AM   Modules accepted: Orders

## 2021-05-06 ENCOUNTER — Other Ambulatory Visit: Payer: Self-pay

## 2021-05-06 ENCOUNTER — Other Ambulatory Visit
Admission: RE | Admit: 2021-05-06 | Discharge: 2021-05-06 | Disposition: A | Discharge status: Home / Self Care | Payer: Managed Care, Other (non HMO) | Source: Ambulatory Visit | Attending: Anesthesiology | Admitting: Anesthesiology

## 2021-05-06 NOTE — Consult Note (Signed)
[facility] Anesthesia Consultation  Kristina Mccall:[id number] DOB: 1985/09/16 DOA: 05/06/2021 PCP: Patient, No Pcp Per (Inactive)   Requesting physician: Dr. Tiburcio Pea Date of consultation: 05/06/21 Reason for consultation: Obesity during pregnancy  CHIEF COMPLAINT:  Obesity during pregnancy  HISTORY OF PRESENT ILLNESS: Kristina Mccall  is a 35 y.o. female with a known history of obesity during pregnancy. This is her first pregnancy. Denies hx of cardiovascular disease. Denies hx of asthma. Has been having some palpitations since she became pregnant and is scheduled to see cardiology. Denies personal or family hx of bleeding disorders.   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/2009   lgsil/hpv present    PAST SURGICAL HISTORY:  Past Surgical History:  Procedure Laterality Date   COLPOSCOPY  2009/2010   no bx done   INTRAUTERINE DEVICE (IUD) INSERTION  10/24/2013    SOCIAL HISTORY:  Social History   Tobacco Use   Smoking status: Never   Smokeless tobacco: Never  Substance Use Topics   Alcohol use: Yes    Comment: socially    FAMILY HISTORY:  Family History  Problem Relation Age of Onset   Cancer Mother        cervical   Hyperlipidemia Mother    Hypertension Mother    Hyperlipidemia Father    Diabetes Maternal Grandmother    Heart disease Maternal Grandfather     DRUG ALLERGIES:  Allergies  Allergen Reactions   Amoxicillin Nausea And Vomiting    Other reaction(s): GI intolerance   Ciprofloxacin Swelling    Pitting edema    Doxycycline  Nausea And Vomiting   Hydrocodone Hives   Methylprednisolone Sodium Succ Hives   Other Hives    methylprednisone   Penicillins Nausea And Vomiting   Latex Rash    REVIEW OF SYSTEMS:   RESPIRATORY: No cough, shortness of breath, wheezing.  CARDIOVASCULAR: No chest pain, orthopnea, edema.  HEMATOLOGY: No anemia, easy bruising or bleeding SKIN: No rash or lesion. NEUROLOGIC: No tingling, numbness, weakness.  PSYCHIATRY: No anxiety or depression.   MEDICATIONS AT HOME:  Prior to Admission medications   Medication Sig Start Date End Date Taking? Authorizing Provider  Doxylamine-Pyridoxine (DICLEGIS) 10-10 MG TBEC Take 2 tablets by mouth at bedtime. If symptoms persist, add one tablet in the morning and one in the afternoon 03/13/21   Nadara Mustard, MD      PHYSICAL EXAMINATION:   VITAL SIGNS: Last menstrual period 01/13/2021.  GENERAL:  35 y.o.-year-old patient no acute distress.  HEENT: Head atraumatic, normocephalic. Oropharynx and nasopharynx clear. MP 2, TM distance >3 cm, normal mouth opening, grade 1 upper lip bite LUNGS: No use of accessory muscles of respiration.   EXTREMITIES: No pedal edema, cyanosis, or clubbing.  NEUROLOGIC: normal gait PSYCHIATRIC: The patient is alert and oriented x 3.  SKIN: No obvious rash, lesion, or ulcer.    IMPRESSION AND PLAN:   Kristina Mccall  is a 35 y.o. female presenting with obesity during pregnancy. BMI is currently 56 at [redacted] weeks gestation.   Reassuring airway exam. Spinal interspaces minimally  palpable.   We discussed analgesic options during labor including epidural analgesia. Discussed that in obesity there can be increased difficulty with epidural placement or even failure of successful epidural. We also discussed that even after successful epidural placement there is increased risk of catheter migration out of the epidural space that would require catheter replacement. Discussed use of epidural vs spinal vs GA if cesarean  delivery is required. Discussed increased risk of difficult intubation during pregnancy should an emergency cesarean delivery be required.   We discussed repeat evaluation at 32-34 weeks by anesthesia to determine whether there is a high risk of complications of anesthesia for which we would recommend transfer of OB care to a facility with a higher maternal level of care designation.

## 2021-05-25 ENCOUNTER — Ambulatory Visit (INDEPENDENT_AMBULATORY_CARE_PROVIDER_SITE_OTHER): Payer: Managed Care, Other (non HMO) | Admitting: Obstetrics & Gynecology

## 2021-05-25 ENCOUNTER — Other Ambulatory Visit: Payer: Self-pay

## 2021-05-25 ENCOUNTER — Telehealth: Payer: Self-pay

## 2021-05-25 ENCOUNTER — Encounter: Payer: Self-pay | Admitting: Obstetrics & Gynecology

## 2021-05-25 VITALS — BP 120/80 | Wt 323.0 lb

## 2021-05-25 DIAGNOSIS — O0992 Supervision of high risk pregnancy, unspecified, second trimester: Secondary | ICD-10-CM

## 2021-05-25 DIAGNOSIS — O99212 Obesity complicating pregnancy, second trimester: Secondary | ICD-10-CM

## 2021-05-25 DIAGNOSIS — O09522 Supervision of elderly multigravida, second trimester: Secondary | ICD-10-CM

## 2021-05-25 NOTE — Patient Instructions (Signed)

## 2021-05-25 NOTE — Telephone Encounter (Signed)
Called cards office to inquire about referral. They did have in the que to be worked. I went ahead and scheduled the appt for the pt and called her with the date and time info. I also let her know that it will be in her My Chart.

## 2021-05-25 NOTE — Telephone Encounter (Signed)
-----   Message from Nadara Mustard, MD sent at 05/25/2021 10:39 AM EST ----- Regarding: Check on referral I put in last month referral to Cardiology/OB at [facility] She has not heard from them

## 2021-05-25 NOTE — Telephone Encounter (Signed)
OK thx  

## 2021-05-25 NOTE — Progress Notes (Signed)
Prenatal Visit Note Date: 05/25/2021 Clinic: [facility]  Subjective:  Kristina Mccall is a 35 y.o. G1P0000 at [redacted]w[redacted]d being seen today for ongoing prenatal care.  She is currently monitored for the following issues for this high-risk pregnancy and has Decreased libido; Anxiety and depression; History of cervical dysplasia; Obesity affecting pregnancy in first trimester; High-risk pregnancy, first trimester; Multigravida of advanced maternal age in first trimester; and BMI 50.0-59.9, adult ([place]) on their problem list.  Patient reports no complaints other than nausea mostly related to GERD.  Contractions: Not present. Vag. Bleeding: None.  Movement: Absent. Denies leaking of fluid.   The following portions of the patient's history were reviewed and updated as appropriate: allergies, current medications, past family history, past medical history, past social history, past surgical history and problem list. Problem list updated.  Objective:   Vitals:   05/25/21 1023  BP: 120/80  Weight: (!) 323 lb (146.5 kg)    Fetal Status:     Movement: Absent     General:  Alert, oriented and cooperative. Patient is in no acute distress.  Skin: Skin is warm and dry. No rash noted.   Cardiovascular: Normal heart rate noted  Respiratory: Normal respiratory effort, no problems with respiration noted  Abdomen: Soft, gravid, appropriate for gestational age. Pain/Pressure: Absent     Pelvic:  Cervical exam deferred        Extremities: Normal range of motion.     Mental Status: Normal mood and affect. Normal behavior. Normal judgment and thought content.   Assessment and Plan:  Pregnancy: G1P0000 at [redacted]w[redacted]d  1. Supervision of high risk pregnancy in second trimester PNV  2. Obesity affecting pregnancy in second trimester ASA daily. [place] MFM next week.  Plan growth [place] monthly thereafter.  Plan APT 34 weeks onward. Anes consult done, plan repeat assessment 34 weeks. Referral to Cardio/ob placed last month, not  scheduled yet  3. Multigravida of advanced maternal age in second trimester NIPT nml MFM [place] next week  General obstetric precautions including but not limited to vaginal bleeding, contractions, leaking of fluid and fetal movement were reviewed in detail with the patient. Please refer to After Visit Summary for other counseling recommendations.   pregnancy 1  Problems (from 03/13/21 to present)    Problem Noted Resolved   Obesity affecting pregnancy in first trimester 03/13/2021 by Gae Dry, MD No   Overview Signed 04/27/2021 10:41 AM by Gae Dry, MD    BMI >=40 [x ] early 1h gtt - 130  [ ]  screen sleep apnea [x ] anesthesia consult (early and late if BMI > 45) [x ] u/s for dating [x ]  [x ] nutritional goals [x ] folic acid 1mg  [x ] bASA (>12 weeks) [ ]  consider nutrition consult [ ]  consider maternal EKG 1st trimester [x ] Growth u/s 28 [ ] , 32 [ ] , 36 weeks [ ]  [x ] NST/AFI weekly 34+ weeks (34[] ,35[] ,36[] , 37[] , 38[] , 39[] , 40[] ) [ ]  IOL by 41 weeks (scheduled, prn [] )       High-risk pregnancy, first trimester 03/13/2021 by Gae Dry, MD No   Overview Signed 04/27/2021 10:43 AM by Gae Dry, MD    Nursing Staff Provider  Office Location  [facility] Dating  LMP, [place]  Language  English Anatomy US    Flu Vaccine   Genetic Screen  NIPS: nml XX  TDaP vaccine    Hgb A1C or  GTT Early : 130 Third trimester :   Covid  LAB RESULTS   Rhogam  n/a Blood Type A/Positive/-- (10/17 1111)   Feeding Plan  Antibody Negative (10/17 1111)  Contraception  Rubella 1.26 (10/17 1111)  Circumcision n/a RPR Non Reactive (10/17 1111)   Pediatrician   HBsAg Negative (10/17 1111)   Support Person  HIV Non Reactive (10/17 1111)  Prenatal Classes Plans online Varicella Imm    GBS  (For PCN allergy, check sensitivities)   BTL Consent     VBAC Consent n/a Pap  nml 07/2020       Pelvis Tested no          Multigravida of advanced maternal age in first trimester          Return in about 2 months (around 07/25/2021) for HROB.  Annamarie Major, MD, [facility], [facility] 05/25/2021  10:41 AM

## 2021-06-02 ENCOUNTER — Encounter: Payer: Self-pay | Admitting: Emergency Medicine

## 2021-06-03 NOTE — Telephone Encounter (Signed)
That's fine if provider there can check and treat urine sx.

## 2021-06-04 ENCOUNTER — Ambulatory Visit: Payer: Managed Care, Other (non HMO) | Attending: Obstetrics and Gynecology

## 2021-06-04 ENCOUNTER — Other Ambulatory Visit: Payer: Self-pay

## 2021-06-04 VITALS — BP 99/68 | HR 89 | Temp 97.8°F | Ht 63.0 in | Wt 320.0 lb

## 2021-06-04 DIAGNOSIS — O99212 Obesity complicating pregnancy, second trimester: Secondary | ICD-10-CM | POA: Diagnosis not present

## 2021-06-04 DIAGNOSIS — O0991 Supervision of high risk pregnancy, unspecified, first trimester: Secondary | ICD-10-CM

## 2021-06-04 DIAGNOSIS — E119 Type 2 diabetes mellitus without complications: Secondary | ICD-10-CM

## 2021-06-04 DIAGNOSIS — Z363 Encounter for antenatal screening for malformations: Secondary | ICD-10-CM | POA: Diagnosis present

## 2021-06-04 DIAGNOSIS — O43192 Other malformation of placenta, second trimester: Secondary | ICD-10-CM

## 2021-06-04 DIAGNOSIS — O09512 Supervision of elderly primigravida, second trimester: Secondary | ICD-10-CM | POA: Insufficient documentation

## 2021-06-04 DIAGNOSIS — O99211 Obesity complicating pregnancy, first trimester: Secondary | ICD-10-CM

## 2021-06-04 DIAGNOSIS — Z3A18 18 weeks gestation of pregnancy: Secondary | ICD-10-CM | POA: Diagnosis not present

## 2021-06-04 DIAGNOSIS — Z3689 Encounter for other specified antenatal screening: Secondary | ICD-10-CM

## 2021-06-04 DIAGNOSIS — O09521 Supervision of elderly multigravida, first trimester: Secondary | ICD-10-CM

## 2021-06-04 DIAGNOSIS — Z6841 Body Mass Index (BMI) 40.0 and over, adult: Secondary | ICD-10-CM

## 2021-06-12 ENCOUNTER — Other Ambulatory Visit: Payer: Self-pay

## 2021-06-12 ENCOUNTER — Ambulatory Visit (INDEPENDENT_AMBULATORY_CARE_PROVIDER_SITE_OTHER): Payer: Managed Care, Other (non HMO) | Admitting: Cardiology

## 2021-06-12 ENCOUNTER — Encounter: Payer: Self-pay | Admitting: Cardiology

## 2021-06-12 ENCOUNTER — Ambulatory Visit (INDEPENDENT_AMBULATORY_CARE_PROVIDER_SITE_OTHER): Payer: Managed Care, Other (non HMO)

## 2021-06-12 VITALS — BP 126/80 | HR 100 | Ht 63.0 in | Wt 326.0 lb

## 2021-06-12 DIAGNOSIS — Z6841 Body Mass Index (BMI) 40.0 and over, adult: Secondary | ICD-10-CM | POA: Diagnosis not present

## 2021-06-12 DIAGNOSIS — R002 Palpitations: Secondary | ICD-10-CM

## 2021-06-12 DIAGNOSIS — O99210 Obesity complicating pregnancy, unspecified trimester: Secondary | ICD-10-CM | POA: Diagnosis not present

## 2021-06-12 DIAGNOSIS — R0602 Shortness of breath: Secondary | ICD-10-CM

## 2021-06-12 NOTE — Progress Notes (Signed)
Cardio-Obstetrics Clinic  New Evaluation  Date:  06/12/2021   ID:  Kristina Mccall, DOB [birth year], MRN [id number]  PCP:  Patient, No Pcp Per (Inactive)   [facility] Providers Cardiologist:  Thomasene Ripple, DO  Electrophysiologist:  None       Referring MD: Nadara Mustard, MD   Chief Complaint: " I am having palpitations"  History of Present Illness:    Kristina Mccall is a 35 y.o. female [G1P0000] who is being seen today for the evaluation of palpitation and shortness of breath at the request of Nadara Mustard, MD.   She has a medical history of anxiety, depression obesity was referred to the cardio obstetrics clinic.  The patient tells me that recently she has been experiencing intermittent palpitations.  She describes this as fast abrupt onset of heartbeat which last for minutes to seconds at a time.  She notes that before the pregnancy she was experiencing these.  Which she notes was exacerbated by caffeine.  But now this is happening frequently.  Associated with her palpitations or shortness of breath.  She denies any chest pain lightheadedness or dizziness.   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 Swallow whole.   Doxylamine-Pyridoxine (DICLEGIS) 10-10 MG TBEC Take 2 tablets by mouth at bedtime. If symptoms persist, add one tablet in the morning and one in the afternoon   esomeprazole (NEXIUM) 20 MG capsule Take 20 mg by mouth daily at 12 noon.   Prenatal Vit-Fe Fumarate-FA (PRENATAL MULTIVITAMIN) TABS tablet Take 1 tablet by mouth daily at 12 noon.     Allergies:   Amoxicillin, Ciprofloxacin, Doxycycline, Hydrocodone, Methylprednisolone sodium succ, Other, Penicillins, and Latex   Social History   Socioeconomic History   Marital status: Married    Spouse name: Not on file   Number of children: Not on file   Years of education: Not on file   Highest education level: Not on file  Occupational History   Not on file  Tobacco Use   Smoking status: Never   Smokeless tobacco: Never  Vaping Use   Vaping Use: Never used  Substance and Sexual Activity   Alcohol use: Yes    Comment: socially   Drug use: No   Sexual activity:  Yes    Birth control/protection: I.U.D.    Comment: [place]  Other Topics Concern   Not on file  Social History Narrative   Not on file   Social Determinants of Health   Financial Resource Strain: Not on file  Food Insecurity: No Food Insecurity   Worried About Programme researcher, broadcasting/film/video in the Last Year: Never true   Ran Out of Food in the Last Year: Never true  Transportation Needs: No Transportation Needs   Lack of Transportation (Medical): No   Lack of Transportation (Non-Medical): No  Physical Activity: Not on file  Stress: Not on file  Social Connections: Not on file      Family History  Problem Relation Age of Onset   Cancer Mother        cervical   Hyperlipidemia Mother    Hypertension Mother    Hyperlipidemia Father    Diabetes Maternal Grandmother    Heart disease Maternal Grandfather       ROS:    Review of Systems  Constitution: Negative for decreased appetite, fever and weight gain.  HENT: Negative for congestion, ear discharge, hoarse voice and sore throat.   Eyes: Negative for discharge, redness, vision loss in right eye and visual halos.  Cardiovascular: Negative for chest pain, dyspnea on exertion, leg swelling, orthopnea and palpitations.  Respiratory: Negative for cough, hemoptysis, shortness of breath and snoring.   Endocrine: Negative for heat intolerance and polyphagia.  Hematologic/Lymphatic: Negative for bleeding problem. Does not bruise/bleed easily.  Skin: Negative for flushing, nail changes, rash and suspicious lesions.  Musculoskeletal: Negative for arthritis, joint pain, muscle cramps, myalgias, neck pain and stiffness.  Gastrointestinal: Negative for abdominal pain, bowel incontinence, diarrhea and excessive appetite.  Genitourinary: Negative for decreased libido, genital sores and incomplete emptying.  Neurological: Negative for brief paralysis, focal weakness, headaches and loss of balance.  Psychiatric/Behavioral: Negative for altered mental status, depression and suicidal ideas.  Allergic/Immunologic: Negative for HIV exposure and persistent infections.    Labs/EKG Reviewed:    EKG:   EKG is was ordered today.  The ekg ordered today demonstrates sinus rhythm, heart rate 100 bpm.  Recent Labs: 04/13/2021: Hemoglobin 13.7; Platelets 312   Recent Lipid Panel Lab Results  Component Value Date/Time   CHOL 163 10/07/2017 08:57 AM   TRIG 56 10/07/2017 08:57 AM   HDL 58 10/07/2017 08:57 AM   CHOLHDL 2.8 10/07/2017 08:57 AM   LDLCALC 94 10/07/2017 08:57 AM    Physical Exam:    VS:  Pulse 100    Ht 5\' 3"  (1.6 m)    Wt (!) 326 lb (147.9 kg)    LMP 01/13/2021    Breastfeeding Unknown    BMI 57.75 kg/m     Wt Readings from Last 3 Encounters:  06/12/21 (!) 326 lb (147.9 kg)  06/04/21 (!) 320 lb (145.2 kg)  05/25/21 (!) 323 lb (146.5 kg)     GEN:  Well  nourished, well developed in no acute distress HEENT: Normal NECK: No JVD; No carotid bruits LYMPHATICS: No lymphadenopathy CARDIAC: RRR, no murmurs, rubs, gallops RESPIRATORY:  Clear to auscultation without rales, wheezing or rhonchi  ABDOMEN: Soft, non-tender, non-distended MUSCULOSKELETAL:  No edema; No deformity  SKIN: Warm and dry NEUROLOGIC:  Alert and oriented x 3 PSYCHIATRIC:  Normal affect    Risk Assessment/Risk Calculators:     CARPREG II Risk Prediction Index Score:  1.  The patient's risk for a primary cardiac event is 5%.  ASSESSMENT & PLAN:    Palpitation Obesity hypertension Shortness of breath   Despite her normal pregnancy physiology for increased heart rate, the characteristics of her palpitations are concerning therefore I like to place a monitor on the patient to rule out other cardiac arrhythmia. She is agreeable to do this.  She will wear her monitor for 14 days.  We talked about her cardiovascular risk factors with her obesity in pregnancy, family history of cardiovascular disease-there is no concern for further ischemic evaluation or any cardiovascular risk ratification testing at this time.  We will continue to follow the patient during her pregnancy.  She is short of breath but this appears to be part of her normal pregnancy physiology we will continue to monitor the patient closely.  I will see her back in 8 weeks should this get worsening and concerning we will consider an echocardiogram at that time.  The patient understands the need to lose weight with diet and exercise. We have discussed specific strategies for this.  I have also encouraged the patient that as she gains her pregnancy weight to not go more than 20 pounds.   Patient Instructions  Medication Instructions:  Your physician recommends that you continue on your current medications as directed. Please refer to the Current Medication list given to you today.  *If you need a  refill on your cardiac medications before your next appointment, please call your pharmacy*   Lab Work: None If you have labs (blood work) drawn today and your tests are completely normal, you will receive your results only by: MyChart Message (if you have MyChart) OR A paper copy in the mail If you have any lab test that is abnormal or we need to change your treatment, we will call you to review the results.   Testing/Procedures: Christena Deem- Long Term Monitor Instructions  Your physician has requested you wear a ZIO patch monitor for 14 days.  This is a single patch monitor. [employer] supplies one patch monitor per enrollment. Additional stickers are not available. Please do not apply patch if you will be having a Nuclear Stress Test,  Echocardiogram, Cardiac CT, MRI, or Chest Xray during the period you would be wearing the  monitor. The patch cannot be worn during these tests. You cannot remove and re-apply the  ZIO XT patch monitor.  Your ZIO patch monitor will be mailed 3 day USPS to your address on file. It may take 3-5 days  to receive your monitor after you have been enrolled.  Once you have received your monitor, please review the enclosed instructions. Your monitor  has already been registered assigning a specific monitor serial # to you.  Billing and Patient Assistance Program Information  We have supplied [employer] with any of your insurance information on file for billing purposes. [employer] offers a sliding scale Patient Assistance Program for patients that do not have  insurance, or whose insurance does not completely cover the cost of the ZIO monitor.  You must apply for the Patient Assistance Program to qualify for this discounted rate.  To apply, please call [employer] at [phone], select option 4, select option 2, ask to apply for  Patient Assistance Program. Meredeth Ide will ask your household income, and how many people  are in your household. They will quote your  out-of-pocket cost based on that information.  [employer] will also be able to set up a 61-month, interest-free payment plan if needed.  Applying the monitor   Shave hair from upper  left chest.  Hold abrader disc by orange tab. Rub abrader in 40 strokes over the upper left chest as  indicated in your monitor instructions.  Clean area with 4 enclosed alcohol pads. Let dry.  Apply patch as indicated in monitor instructions. Patch will be placed under collarbone on left  side of chest with arrow pointing upward.  Rub patch adhesive wings for 2 minutes. Remove white label marked "1". Remove the white  label marked "2". Rub patch adhesive wings for 2 additional minutes.  While looking in a mirror, press and release button in center of patch. A small green light will  flash 3-4 times. This will be your only indicator that the monitor has been turned on.  Do not shower for the first 24 hours. You may shower after the first 24 hours.  Press the button if you feel a symptom. You will hear a small click. Record Date, Time and  Symptom in the Patient Logbook.  When you are ready to remove the patch, follow instructions on the last 2 pages of Patient  Logbook. Stick patch monitor onto the last page of Patient Logbook.  Place Patient Logbook in the blue and white box. Use locking tab on box and tape box closed  securely. The blue and white box has prepaid postage on it. Please place it in the mailbox as  soon as possible. Your physician should have your test results approximately 7 days after the  monitor has been mailed back to [facility].  Call [employer] Customer Care at [phone] if you have questions regarding  your ZIO XT patch monitor. Call them immediately if you see an orange light blinking on your  monitor.  If your monitor falls off in less than 4 days, contact our Monitor department at [phone].  If your monitor becomes loose or falls off after 4 days call [employer] at  [phone] for  suggestions on securing your monitor.    Follow-Up: At [facility], you and your health needs are our priority.  As part of our continuing mission to provide you with exceptional heart care, we have created designated Provider Care Teams.  These Care Teams include your primary Cardiologist (physician) and Advanced Practice Providers (APPs -  Physician Assistants and Nurse Practitioners) who all work together to provide you with the care you need, when you need it.  We recommend signing up for the patient portal called "MyChart".  Sign up information is provided on this After Visit Summary.  MyChart is used to connect with patients for Virtual Visits (Telemedicine).  Patients are able to view lab/test results, encounter notes, upcoming appointments, etc.  Non-urgent messages can be sent to your provider as well.   To learn more about what you can do with MyChart, go to [email or web address].    Your next appointment:   8 week(s)  The format for your next appointment:   In Person  Provider:   Thomasene Ripple  [facility] [address] [place], [place] [address]     Other Instructions     Dispo:  Return in about 8 weeks (around 08/07/2021).   Medication Adjustments/Labs and Tests Ordered: Current medicines are reviewed at length with the patient today.  Concerns regarding medicines are outlined above.  Tests Ordered: Orders Placed This Encounter  Procedures   LONG TERM MONITOR (3-14 DAYS)    Medication Changes: No orders of the defined types were placed in this encounter.

## 2021-06-12 NOTE — Patient Instructions (Signed)
Medication Instructions:  Your physician recommends that you continue on your current medications as directed. Please refer to the Current Medication list given to you today.  *If you need a refill on your cardiac medications before your next appointment, please call your pharmacy*   Lab Work: None If you have labs (blood work) drawn today and your tests are completely normal, you will receive your results only by: [place] (if you have MyChart) OR A paper copy in the mail If you have any lab test that is abnormal or we need to change your treatment, we will call you to review the results.   Testing/Procedures: Bryn Gulling- Long Term Monitor Instructions  Your physician has requested you wear a ZIO patch monitor for 14 days.  This is a single patch monitor. [employer] supplies one patch monitor per enrollment. Additional stickers are not available. Please do not apply patch if you will be having a Nuclear Stress Test,  Echocardiogram, Cardiac CT, MRI, or Chest Xray during the period you would be wearing the  monitor. The patch cannot be worn during these tests. You cannot remove and re-apply the  ZIO XT patch monitor.  Your ZIO patch monitor will be mailed 3 day USPS to your address on file. It may take 3-5 days  to receive your monitor after you have been enrolled.  Once you have received your monitor, please review the enclosed instructions. Your monitor  has already been registered assigning a specific monitor serial # to you.  Billing and Patient Assistance Program Information  We have supplied [employer] with any of your insurance information on file for billing purposes. [employer] offers a sliding scale Patient Assistance Program for patients that do not have  insurance, or whose insurance does not completely cover the cost of the ZIO monitor.  You must apply for the Patient Assistance Program to qualify for this discounted rate.  To apply, please call [employer] at [phone], select  option 4, select option 2, ask to apply for  Patient Assistance Program. Theodore Demark will ask your household income, and how many people  are in your household. They will quote your out-of-pocket cost based on that information.  [employer] will also be able to set up a 95-month, interest-free payment plan if needed.  Applying the monitor   Shave hair from upper left chest.  Hold abrader disc by orange tab. Rub abrader in 40 strokes over the upper left chest as  indicated in your monitor instructions.  Clean area with 4 enclosed alcohol pads. Let dry.  Apply patch as indicated in monitor instructions. Patch will be placed under collarbone on left  side of chest with arrow pointing upward.  Rub patch adhesive wings for 2 minutes. Remove white label marked "1". Remove the white  label marked "2". Rub patch adhesive wings for 2 additional minutes.  While looking in a mirror, press and release button in center of patch. A small green light will  flash 3-4 times. This will be your only indicator that the monitor has been turned on.  Do not shower for the first 24 hours. You may shower after the first 24 hours.  Press the button if you feel a symptom. You will hear a small click. Record Date, Time and  Symptom in the Patient Logbook.  When you are ready to remove the patch, follow instructions on the last 2 pages of Patient  Logbook. Stick patch monitor onto the last page of Patient Logbook.  Place Patient Logbook in  the blue and white box. Use locking tab on box and tape box closed  securely. The blue and white box has prepaid postage on it. Please place it in the mailbox as  soon as possible. Your physician should have your test results approximately 7 days after the  monitor has been mailed back to [facility].  Call [facility] Customer Care at [phone] if you have questions regarding  your ZIO XT patch monitor. Call them immediately if you see an orange light blinking on your  monitor.   If your monitor falls off in less than 4 days, contact our Monitor department at [phone].  If your monitor becomes loose or falls off after 4 days call [employer] at [phone] for  suggestions on securing your monitor.    Follow-Up: At [facility], you and your health needs are our priority.  As part of our continuing mission to provide you with exceptional heart care, we have created designated Provider Care Teams.  These Care Teams include your primary Cardiologist (physician) and Advanced Practice Providers (APPs -  Physician Assistants and Nurse Practitioners) who all work together to provide you with the care you need, when you need it.  We recommend signing up for the patient portal called "MyChart".  Sign up information is provided on this After Visit Summary.  MyChart is used to connect with patients for Virtual Visits (Telemedicine).  Patients are able to view lab/test results, encounter notes, upcoming appointments, etc.  Non-urgent messages can be sent to your provider as well.   To learn more about what you can do with MyChart, go to [email or web address].    Your next appointment:   8 week(s)  The format for your next appointment:   In Person  Provider:   Thomasene Ripple  [facility] [address] [place], [place] [address]     Other Instructions

## 2021-06-15 ENCOUNTER — Ambulatory Visit: Payer: Managed Care, Other (non HMO) | Admitting: Cardiology

## 2021-06-16 NOTE — Telephone Encounter (Signed)
Can you advise? 

## 2021-06-16 NOTE — Addendum Note (Signed)
Addended by: Dorris Fetch on: 06/16/2021 11:50 AM   Modules accepted: Orders

## 2021-06-17 NOTE — Telephone Encounter (Signed)
Can you please advise how I can assist this patient without just saying to contact her insurance?

## 2021-06-18 NOTE — Telephone Encounter (Signed)
Contact patient via phone. Patient is aware of difference.

## 2021-06-26 ENCOUNTER — Encounter: Payer: Managed Care, Other (non HMO) | Admitting: Obstetrics and Gynecology

## 2021-06-26 ENCOUNTER — Other Ambulatory Visit: Payer: Self-pay

## 2021-06-26 ENCOUNTER — Ambulatory Visit (INDEPENDENT_AMBULATORY_CARE_PROVIDER_SITE_OTHER): Payer: Managed Care, Other (non HMO) | Admitting: Obstetrics & Gynecology

## 2021-06-26 ENCOUNTER — Encounter: Payer: Self-pay | Admitting: Obstetrics & Gynecology

## 2021-06-26 VITALS — BP 100/60 | Wt 325.0 lb

## 2021-06-26 DIAGNOSIS — O0991 Supervision of high risk pregnancy, unspecified, first trimester: Secondary | ICD-10-CM

## 2021-06-26 DIAGNOSIS — O09522 Supervision of elderly multigravida, second trimester: Secondary | ICD-10-CM

## 2021-06-26 DIAGNOSIS — Z131 Encounter for screening for diabetes mellitus: Secondary | ICD-10-CM

## 2021-06-26 DIAGNOSIS — Z3A21 21 weeks gestation of pregnancy: Secondary | ICD-10-CM

## 2021-06-26 DIAGNOSIS — O99212 Obesity complicating pregnancy, second trimester: Secondary | ICD-10-CM

## 2021-06-26 LAB — POCT URINALYSIS DIPSTICK OB
Glucose, UA: NEGATIVE
POC,PROTEIN,UA: NEGATIVE

## 2021-06-26 NOTE — Progress Notes (Signed)
Prenatal Visit Note Date: 06/26/2021 Clinic: [facility]  Subjective:  Kristina Mccall is a 35 y.o. G1P0000 at [redacted]w[redacted]d being seen today for ongoing prenatal care.  She is currently monitored for the following issues for this high-risk pregnancy and has Decreased libido; Anxiety and depression; History of cervical dysplasia; Obesity affecting pregnancy in first trimester; High-risk pregnancy, first trimester; BMI 50.0-59.9, adult (HCC); and Obesity in pregnancy on their problem list.  Patient reports she still has some palpitations.  She has been on Holter Monitor for the last 2 weeks and will see what Dr Servando Salina says in reaction to these results.  No SOB or dizziness.  No pain or bleeding..   Contractions: Not present. Vag. Bleeding: None.  Movement: Present. Denies leaking of fluid.   The following portions of the patient's history were reviewed and updated as appropriate: allergies, current medications, past family history, past medical history, past social history, past surgical history and problem list. Problem list updated.  Objective:   Vitals:   06/26/21 1403  BP: 100/60  Weight: (!) 325 lb (147.4 kg)    Fetal Status:     Movement: Present     General:  Alert, oriented and cooperative. Patient is in no acute distress.  Skin: Skin is warm and dry. No rash noted.   Cardiovascular: Normal heart rate noted  Respiratory: Normal respiratory effort, no problems with respiration noted  Abdomen: Soft, gravid, appropriate for gestational age. Pain/Pressure: Absent     Pelvic:  Cervical exam deferred        Extremities: Normal range of motion.     Mental Status: Normal mood and affect. Normal behavior. Normal judgment and thought content.   Assessment and Plan:  Pregnancy: G1P0000 at [redacted]w[redacted]d  1. High-risk pregnancy, first trimester Growth [place] monthly PNV  2. Obesity affecting pregnancy in second trimester ASA daily. [place] MFM monthly.  Plan APT 34 weeks onward. Anes consult done, plan repeat  assessment 34 weeks. Discussed high risk care and need for delivery planning with MDs  3. Multigravida of advanced maternal age in second trimester NIPT nml  4. [redacted] weeks gestation of pregnancy  5. Screening for diabetes mellitus - 28 Week RH+Panel; Future  Preterm labor symptoms and general obstetric precautions including but not limited to vaginal bleeding, contractions, leaking of fluid and fetal movement were reviewed in detail with the patient. Please refer to After Visit Summary for other counseling recommendations.   pregnancy 1  Problems (from 03/13/21 to 06/12/21)    Problem Noted Resolved   Obesity affecting pregnancy in first trimester 03/13/2021 by Nadara Mustard, MD No   Overview Signed 04/27/2021 10:41 AM by Nadara Mustard, MD    BMI >=40 [x ] early 1h gtt - 130  [ ]  screen sleep apnea [x ] anesthesia consult (early and late if BMI > 45) [x ] u/s for dating [x ]  [x ] nutritional goals [x ] folic acid 1mg  [x ] bASA (>12 weeks) [ ]  consider nutrition consult [ ]  consider maternal EKG 1st trimester [x ] Growth u/s 28 [ ] , 32 [ ] , 36 weeks [ ]  [x ] NST/AFI weekly 34+ weeks (34[] ,35[] ,36[] , 37[] , 38[] , 39[] , 40[] ) [ ]  IOL by 41 weeks (scheduled, prn [] )       High-risk pregnancy, first trimester 03/13/2021 by , MD No   Overview Signed 04/27/2021 10:43 AM by , MD    Nursing Staff Provider  Office Location  [facility] Dating  LMP,  Language  English Anatomy US    Flu Vaccine   Genetic Screen  NIPS: nml XX  TDaP vaccine    Hgb A1C or  GTT Early : 130 Third trimester :   Covid    LAB RESULTS   Rhogam  n/a Blood Type A/Positive/-- (10/17 1111)   Feeding Plan  Antibody Negative (10/17 1111)  Contraception  Rubella 1.26 (10/17 1111)  Circumcision n/a RPR Non Reactive (10/17 1111)   Pediatrician   HBsAg Negative (10/17 1111)   Support Person  HIV Non Reactive (10/17 1111)  Prenatal Classes Plans online Varicella Imm    GBS  (For  PCN allergy, check sensitivities)   BTL Consent     VBAC Consent n/a Pap  nml 07/2020       Pelvis Tested no          Multigravida of advanced maternal age in first trimester 03/13/2021 by Gae Dry, MD 06/12/2021 by Berniece Salines, DO       Return in about 1 and 2 months for HROB w glucola.  Barnett Applebaum, MD, [facility], [facility] 06/26/2021  2:24 PM

## 2021-06-28 NOTE — L&D Delivery Note (Signed)
Delivery Summary for Kristina Mccall ? ?Labor Events:   ?Preterm labor: No data found  ?Rupture date: 10/21/2021  ?Rupture time: 6:00 PM  ?Rupture type: Spontaneous ?Possible ROM - for evaluation  ?Fluid Color: Clear ?Bloody  ?Induction: No data found  ?Augmentation: No data found  ?Complications: No data found  ?Cervical ripening: No data found No data found  ? No data found  ?   ?Delivery:   ?Episiotomy: No data found  ?Lacerations: No data found  ?Repair suture: No data found  ?Repair # of packets: No data found  ?Blood loss (ml): 428  ? ?Information for the patient's newborn:  Allianna, Beaubien [[id number]]  ? ?Delivery ?10/22/2021 2:16 AM by  C-Section, Low Transverse ?Sex:  female Gestational Age: [redacted]w[redacted]d ?Delivery Clinician:   ?Living?:  ? ?      APGARS  One minute Five minutes Ten minutes  ?Skin color:        ?Heart rate:        ?Grimace:        ?Muscle tone:        ?Breathing:        ?Totals: 8  9     ? ?Presentation/position:      ?Resuscitation:   ?Cord information:    Disposition of cord blood:     Blood gases sent?  ?Complications:   ?Placenta: Delivered:       appearance ?Newborn Measurements: ?Weight: 7 lb 11.8 oz (3510 g)  Height: 20.67"  Head circumference:    Chest circumference:    ?Other providers:    ?Additional  information: ?Forceps:   ?Vacuum:   ?Breech:   ?Observed anomalies   ? ? ? ? ? ?See Dr. Oretha Milch operative note for details of C-section procedure.  ? ? ?Hildred Laser, MD ?[facility] ? ?

## 2021-07-01 NOTE — Telephone Encounter (Signed)
Can you add to nurse schedule Friday Morning? Possible uti

## 2021-07-01 NOTE — Telephone Encounter (Signed)
Contacted patient via phone. Patient is scheduled for 07/03/21

## 2021-07-03 ENCOUNTER — Ambulatory Visit: Payer: Managed Care, Other (non HMO)

## 2021-07-07 ENCOUNTER — Other Ambulatory Visit: Payer: Self-pay

## 2021-07-07 DIAGNOSIS — O43192 Other malformation of placenta, second trimester: Secondary | ICD-10-CM

## 2021-07-07 DIAGNOSIS — O99210 Obesity complicating pregnancy, unspecified trimester: Secondary | ICD-10-CM

## 2021-07-07 DIAGNOSIS — F32A Depression, unspecified: Secondary | ICD-10-CM

## 2021-07-07 DIAGNOSIS — F419 Anxiety disorder, unspecified: Secondary | ICD-10-CM

## 2021-07-09 ENCOUNTER — Encounter: Payer: Self-pay | Admitting: Obstetrics & Gynecology

## 2021-07-09 ENCOUNTER — Other Ambulatory Visit: Payer: Self-pay

## 2021-07-09 ENCOUNTER — Ambulatory Visit: Payer: Managed Care, Other (non HMO) | Attending: Obstetrics and Gynecology

## 2021-07-09 DIAGNOSIS — Z3A23 23 weeks gestation of pregnancy: Secondary | ICD-10-CM | POA: Diagnosis not present

## 2021-07-09 DIAGNOSIS — Z363 Encounter for antenatal screening for malformations: Secondary | ICD-10-CM | POA: Diagnosis not present

## 2021-07-09 DIAGNOSIS — O99210 Obesity complicating pregnancy, unspecified trimester: Secondary | ICD-10-CM

## 2021-07-09 DIAGNOSIS — O09512 Supervision of elderly primigravida, second trimester: Secondary | ICD-10-CM | POA: Diagnosis not present

## 2021-07-09 DIAGNOSIS — O43192 Other malformation of placenta, second trimester: Secondary | ICD-10-CM | POA: Diagnosis not present

## 2021-07-09 DIAGNOSIS — O99212 Obesity complicating pregnancy, second trimester: Secondary | ICD-10-CM | POA: Insufficient documentation

## 2021-07-09 DIAGNOSIS — F419 Anxiety disorder, unspecified: Secondary | ICD-10-CM | POA: Diagnosis not present

## 2021-07-09 DIAGNOSIS — O99342 Other mental disorders complicating pregnancy, second trimester: Secondary | ICD-10-CM | POA: Diagnosis not present

## 2021-07-09 DIAGNOSIS — F32A Depression, unspecified: Secondary | ICD-10-CM | POA: Diagnosis not present

## 2021-07-16 ENCOUNTER — Encounter: Payer: Self-pay | Admitting: Obstetrics & Gynecology

## 2021-07-16 NOTE — Telephone Encounter (Signed)
Is this brown discharge normal?

## 2021-07-24 ENCOUNTER — Ambulatory Visit (INDEPENDENT_AMBULATORY_CARE_PROVIDER_SITE_OTHER): Payer: Managed Care, Other (non HMO) | Admitting: Obstetrics and Gynecology

## 2021-07-24 ENCOUNTER — Encounter: Payer: Self-pay | Admitting: Obstetrics and Gynecology

## 2021-07-24 ENCOUNTER — Other Ambulatory Visit: Payer: Self-pay

## 2021-07-24 VITALS — BP 120/72 | Ht 63.0 in | Wt 325.6 lb

## 2021-07-24 DIAGNOSIS — O0990 Supervision of high risk pregnancy, unspecified, unspecified trimester: Secondary | ICD-10-CM

## 2021-07-24 DIAGNOSIS — Z3A25 25 weeks gestation of pregnancy: Secondary | ICD-10-CM

## 2021-07-24 DIAGNOSIS — Z6841 Body Mass Index (BMI) 40.0 and over, adult: Secondary | ICD-10-CM

## 2021-07-24 NOTE — Progress Notes (Signed)
° ° °  Routine Prenatal Care Visit  Subjective  Kristina Mccall is a 36 y.o. G1P0000 at [redacted]w[redacted]d being seen today for ongoing prenatal care.  She is currently monitored for the following issues for this high-risk pregnancy and has Decreased libido; Anxiety and depression; History of cervical dysplasia; Obesity affecting pregnancy in first trimester; Supervision of high risk pregnancy, antepartum; BMI 50.0-59.9, adult ([place]); and Obesity in pregnancy on their problem list.  ----------------------------------------------------------------------------------- Patient reports no complaints.   Contractions: Not present. Vag. Bleeding: None.  Movement: Present. Denies leaking of fluid.  ----------------------------------------------------------------------------------- The following portions of the patient's history were reviewed and updated as appropriate: allergies, current medications, past family history, past medical history, past social history, past surgical history and problem list. Problem list updated.   Objective  Blood pressure 120/72, height 5\' 3"  (1.6 m), weight (!) 325 lb 9.6 oz (147.7 kg), last menstrual period 01/13/2021. Pregravid weight 317 lb (143.8 kg) Total Weight Gain 8 lb 9.6 oz (3.901 kg) Urinalysis:      Fetal Status: Fetal Heart Rate (bpm): 160   Movement: Present     General:  Alert, oriented and cooperative. Patient is in no acute distress.  Skin: Skin is warm and dry. No rash noted.   Cardiovascular: Normal heart rate noted  Respiratory: Normal respiratory effort, no problems with respiration noted  Abdomen: Soft, gravid, appropriate for gestational age. Pain/Pressure: Absent     Pelvic:  Cervical exam deferred        Extremities: Normal range of motion.  Edema: None  Mental Status: Normal mood and affect. Normal behavior. Normal judgment and thought content.     Assessment   36 y.o. G1P0000 at [redacted]w[redacted]d by  11/03/2021, Date entered prior to episode creation presenting for  routine prenatal visit  Plan   Pregnancy Problems (from 07/09/21 to present)     Problem Noted Resolved   Supervision of high risk pregnancy, antepartum 03/13/2021 by Gae Dry, MD No   Overview Signed 04/27/2021 10:43 AM by Gae Dry, MD    Nursing Staff Provider  Office Location  [facility] Dating  LMP, [place]  Language  English Anatomy US    Flu Vaccine   Genetic Screen  NIPS: nml XX  TDaP vaccine    Hgb A1C or  GTT Early : 130 Third trimester :   Covid    LAB RESULTS   Rhogam  n/a Blood Type A/Positive/-- (10/17 1111)   Feeding Plan  Antibody Negative (10/17 1111)  Contraception  Rubella 1.26 (10/17 1111)  Circumcision n/a RPR Non Reactive (10/17 1111)   Pediatrician   HBsAg Negative (10/17 1111)   Support Person  HIV Non Reactive (10/17 1111)  Prenatal Classes Plans online Varicella Imm    GBS  (For PCN allergy, check sensitivities)   BTL Consent     VBAC Consent n/a Pap  nml 07/2020       Pelvis Tested no               Gestational age appropriate obstetric precautions including but not limited to vaginal bleeding, contractions, leaking of fluid and fetal movement were reviewed in detail with the patient.    Return in about 3 weeks (around 08/14/2021) for HROB and 1 GTT with MD.  Homero Fellers MD [facility] 07/24/2021, 10:37 AM

## 2021-08-04 ENCOUNTER — Other Ambulatory Visit: Payer: Self-pay

## 2021-08-04 DIAGNOSIS — Z87410 Personal history of cervical dysplasia: Secondary | ICD-10-CM

## 2021-08-04 DIAGNOSIS — O09513 Supervision of elderly primigravida, third trimester: Secondary | ICD-10-CM

## 2021-08-04 DIAGNOSIS — O99210 Obesity complicating pregnancy, unspecified trimester: Secondary | ICD-10-CM

## 2021-08-04 DIAGNOSIS — F419 Anxiety disorder, unspecified: Secondary | ICD-10-CM

## 2021-08-04 DIAGNOSIS — F32A Depression, unspecified: Secondary | ICD-10-CM

## 2021-08-06 ENCOUNTER — Ambulatory Visit: Payer: Managed Care, Other (non HMO) | Attending: Obstetrics

## 2021-08-06 ENCOUNTER — Other Ambulatory Visit: Payer: Self-pay

## 2021-08-06 VITALS — BP 112/75 | HR 84 | Temp 98.2°F | Ht 63.0 in | Wt 324.0 lb

## 2021-08-06 DIAGNOSIS — Z3A27 27 weeks gestation of pregnancy: Secondary | ICD-10-CM | POA: Diagnosis not present

## 2021-08-06 DIAGNOSIS — F419 Anxiety disorder, unspecified: Secondary | ICD-10-CM | POA: Diagnosis not present

## 2021-08-06 DIAGNOSIS — O99212 Obesity complicating pregnancy, second trimester: Secondary | ICD-10-CM | POA: Diagnosis not present

## 2021-08-06 DIAGNOSIS — O99210 Obesity complicating pregnancy, unspecified trimester: Secondary | ICD-10-CM | POA: Diagnosis not present

## 2021-08-06 DIAGNOSIS — O43122 Velamentous insertion of umbilical cord, second trimester: Secondary | ICD-10-CM | POA: Diagnosis not present

## 2021-08-06 DIAGNOSIS — O0990 Supervision of high risk pregnancy, unspecified, unspecified trimester: Secondary | ICD-10-CM

## 2021-08-06 DIAGNOSIS — O43192 Other malformation of placenta, second trimester: Secondary | ICD-10-CM

## 2021-08-06 DIAGNOSIS — O09513 Supervision of elderly primigravida, third trimester: Secondary | ICD-10-CM

## 2021-08-06 DIAGNOSIS — O99342 Other mental disorders complicating pregnancy, second trimester: Secondary | ICD-10-CM | POA: Insufficient documentation

## 2021-08-06 DIAGNOSIS — Z87410 Personal history of cervical dysplasia: Secondary | ICD-10-CM

## 2021-08-06 DIAGNOSIS — F32A Depression, unspecified: Secondary | ICD-10-CM

## 2021-08-06 DIAGNOSIS — Z363 Encounter for antenatal screening for malformations: Secondary | ICD-10-CM | POA: Diagnosis present

## 2021-08-06 DIAGNOSIS — O09512 Supervision of elderly primigravida, second trimester: Secondary | ICD-10-CM | POA: Diagnosis not present

## 2021-08-07 ENCOUNTER — Other Ambulatory Visit: Payer: Self-pay | Admitting: Obstetrics & Gynecology

## 2021-08-07 ENCOUNTER — Encounter: Payer: Self-pay | Admitting: Obstetrics & Gynecology

## 2021-08-07 ENCOUNTER — Encounter: Payer: Self-pay | Admitting: Cardiology

## 2021-08-07 ENCOUNTER — Ambulatory Visit (INDEPENDENT_AMBULATORY_CARE_PROVIDER_SITE_OTHER): Payer: Managed Care, Other (non HMO) | Admitting: Cardiology

## 2021-08-07 VITALS — BP 126/80 | HR 88 | Ht 63.0 in | Wt 324.0 lb

## 2021-08-07 DIAGNOSIS — Z6841 Body Mass Index (BMI) 40.0 and over, adult: Secondary | ICD-10-CM

## 2021-08-07 DIAGNOSIS — O09513 Supervision of elderly primigravida, third trimester: Secondary | ICD-10-CM

## 2021-08-07 DIAGNOSIS — F419 Anxiety disorder, unspecified: Secondary | ICD-10-CM | POA: Diagnosis not present

## 2021-08-07 DIAGNOSIS — I471 Supraventricular tachycardia: Secondary | ICD-10-CM | POA: Diagnosis not present

## 2021-08-07 DIAGNOSIS — O99210 Obesity complicating pregnancy, unspecified trimester: Secondary | ICD-10-CM

## 2021-08-07 DIAGNOSIS — O0990 Supervision of high risk pregnancy, unspecified, unspecified trimester: Secondary | ICD-10-CM

## 2021-08-07 DIAGNOSIS — F32A Depression, unspecified: Secondary | ICD-10-CM

## 2021-08-07 NOTE — Progress Notes (Signed)
Cardio-Obstetrics Clinic  Follow Up Note   Date:  08/09/2021   ID:  Kristina Mccall, DOB December 14, 1985, MRN [id number]  PCP:  Patient, No Pcp Per (Inactive)   [facility] Providers Cardiologist:  Berniece Salines, DO  Electrophysiologist:  None        Referring MD: No ref. provider found   Chief Complaint: Heart palpitations  History of Present Illness:    Kristina Mccall is a 36 y.o. female [id number] who returns for follow up of her palpitations and shortness of breath.  Patient has medical history of anxiety, depression, obesity.  She is referred today for cardiac obstetric clinic.  Since being last seen she had Zio patch placed which showed infrequent episodes of SVT.  She reports that she would press the button when she was feeling these palpitations and they did not correlate with when the episodes of SVT were occurring.  She will check her heart rate at these times and it would typically be in the 80s.  She reports that prior to pregnancy her heart rate was typically around 50-60 bpm.  She was having these episodes of heart palpitations prior to pregnancy but they are more frequent now.  Patient reports that since being seen she is still having these heart palpitations but they have not increased in frequency.  She denies any chest pain, irregular shortness of breath, headaches, changes in vision.  She reports "normal" swelling in her lower extremities which resolves with elevation of her feet.   Prior CV Studies Reviewed: The following studies were reviewed today: Patch Wear Time:  13 days and 21 hours (2022-12-16T14:11:44-0500 to 2022-12-30T11:26:00-0500)   Patient had a min HR of 56 bpm, max HR of 190 bpm, and avg HR of 88 bpm. Predominant underlying rhythm was Sinus Rhythm.   3 Supraventricular Tachycardia runs occurred, the run with the fastest interval lasting 13 beats with a max rate of 190 bpm (avg 161  bpm); the run with the fastest interval was also the longest. Isolated  SVEs were rare (<1.0%), SVE Couplets were rare (<1.0%), and SVE Triplets were rare (<1.0%). Isolated VEs were rare (<1.0%), and no VE Couplets or VE Triplets were present. Ventricular  Bigeminy and Trigeminy were present.    Symptoms associated with sinus rhythm and rare premature atrial complexes.     Conclusion: This study is remarkable for rare paroxysmal supraventricular tachycardia.  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 Swallow whole.   esomeprazole (NEXIUM) 20 MG capsule Take 20 mg by mouth daily at 12 noon.   Prenatal Vit-Fe Fumarate-FA (PRENATAL MULTIVITAMIN) TABS tablet Take 1 tablet by mouth daily at 12 noon.     Allergies:   Amoxicillin, Ciprofloxacin, Doxycycline, Hydrocodone, Methylprednisolone sodium succ, Other, Penicillins, and Latex   Social History   Socioeconomic History   Marital status: Married    Spouse name: Not on file   Number of children: Not on file   Years of education: Not on file   Highest education level: Not on file  Occupational History   Occupation: Urgent Care  Tobacco Use   Smoking status: Never   Smokeless  tobacco: Never  Vaping Use   Vaping Use: Never used  Substance and Sexual Activity   Alcohol use: Not Currently   Drug use: No   Sexual activity: Yes    Comment: [place]  Other Topics Concern   Not on file  Social History Narrative   Not on file   Social Determinants of Health   Financial Resource Strain: Not on file  Food Insecurity: No Food Insecurity   Worried About Programme researcher, broadcasting/film/video in the Last Year: Never true   Ran Out of Food in the Last Year: Never true  Transportation Needs: No Transportation Needs   Lack of Transportation (Medical): No   Lack of Transportation (Non-Medical): No  Physical Activity: Not on file  Stress: Not on file  Social Connections: Not on file      Family History  Problem Relation Age of Onset   Cancer Mother        cervical   Hyperlipidemia Mother    Hypertension Mother    Hyperlipidemia Father    Diabetes Maternal Grandmother    Heart disease Maternal Grandfather       ROS:   Please see the history of present illness.    Denies any chest pain, headaches, change in vision, irregular lower extremity edema, right upper quadrant pain. Acknowledges shortness of breath on exertion which she feels is normal, lower extremity swelling which she reports occurred prior to pregnancy and has not worsened. All other systems reviewed and are negative.   Labs/EKG Reviewed:    Recent Labs: 04/13/2021: Hemoglobin 13.7; Platelets 312   Recent Lipid Panel Lab Results  Component Value Date/Time   CHOL 163 10/07/2017 08:57 AM   TRIG 56 10/07/2017 08:57 AM   HDL 58 10/07/2017 08:57 AM   CHOLHDL 2.8 10/07/2017 08:57 AM   LDLCALC 94 10/07/2017 08:57 AM    Physical Exam:    VS:  BP 126/80    Pulse 88    Ht 5\' 3"  (1.6 m)    Wt (!) 324 lb (147 kg)    LMP 01/13/2021    SpO2 97%    BMI 57.39 kg/m     Wt Readings from Last 3 Encounters:  08/07/21 (!) 324 lb (147 kg)  08/06/21 (!) 324 lb (147 kg)  07/24/21 (!) 325 lb 9.6 oz (147.7 kg)     GEN:   Well nourished, well developed in no acute distress HEENT: Normal NECK: No JVD; No carotid bruits LYMPHATICS: No lymphadenopathy CARDIAC: RRR, no murmurs, rubs, gallops RESPIRATORY:  Clear to auscultation without rales, wheezing or rhonchi  ABDOMEN: Soft, non-tender, non-distended MUSCULOSKELETAL:  No edema; No deformity  SKIN: Warm and dry NEUROLOGIC:  Alert and oriented  x 3 PSYCHIATRIC:  Normal affect    Risk Assessment/Risk Calculators:     CARPREG II Risk Prediction Index Score:  1.  The patient's risk for a primary cardiac event is 5%.            ASSESSMENT & PLAN:    Paroxysmal SVT Advanced maternal age Obesity in pregnancy High risk pregnancy Health education/counseling  Patient initially evaluated for frequent heart palpitations as well as shortness of breath.  Zio patch showing intermittent episodes of SVT not associated with patient's heart palpitations.  Patient's symptoms have not improved but have not worsened since previous evaluation.  We will continue to monitor during the pregnancy but not add any additional medications at this time.  Do not feel like echocardiogram is indicated at this time.  Patient has been watching her diet and staying active but not regularly exercising throughout the pregnancy.  She reports that she is on her feet during work.  Her weight has been stable from previous visits.  Encouraged continuation of this.  Of note patient does have concerns regarding her current OB care.  She is seeing a provider in [place] who is leaving the practice just prior to her delivery.  She is looking for a new OB provider but has been unsuccessful in these attempts.  We will discuss this with high-risk OB team here at [facility] to determine if there are any options for this patient regarding her care.  We will follow-up with this patient in the second week of April to evaluate for any changes needed prior to delivery or sooner if needed..  Patient seen  and examined, note reviewed with the signed Resident. I personally reviewed laboratory data, imaging studies and relevant notes. I independently examined the patient and formulated the important aspects of the plan. I have personally discussed the plan with the patient and/or family. Comments or changes to the note/plan are indicated below.   Berniece Salines DO, MS [facility] Attending Cardiologist [place]  [address] [place], [place] [address] [phone] Website: [email or web address]   Patient Instructions  Medication Instructions:  Your physician recommends that you continue on your current medications as directed. Please refer to the Current Medication list given to you today.  *If you need a refill on your cardiac medications before your next appointment, please call your pharmacy*   Lab Work: None If you have labs (blood work) drawn today and your tests are completely normal, you will receive your results only by: [place] (if you have MyChart) OR A paper copy in the mail If you have any lab test that is abnormal or we need to change your treatment, we will call you to review the results.   Testing/Procedures: None   Follow-Up: At [facility], you and your health needs are our priority.  As part of our continuing mission to provide you with exceptional heart care, we have created designated Provider Care Teams.  These Care Teams include your primary Cardiologist (physician) and Advanced Practice Providers (APPs -  Physician Assistants and Nurse Practitioners) who all work together to provide you with the care you need, when you need it.  We recommend signing up for the patient portal called "MyChart".  Sign up information is provided on this After Visit Summary.  MyChart is used to connect with patients for Virtual Visits (Telemedicine).  Patients are able to view lab/test results, encounter notes, upcoming appointments, etc.  Non-urgent  messages can be sent to  your provider as well.   To learn more about what you can do with MyChart, go to [email or web address].    Your next appointment:   Second week in April  The format for your next appointment:   In Person  Provider:   Berniece Salines  [facility] [address] [place], [place] [address]     Other Instructions    Dispo:  No follow-ups on file.   Medication Adjustments/Labs and Tests Ordered: Current medicines are reviewed at length with the patient today.  Concerns regarding medicines are outlined above.  Tests Ordered: No orders of the defined types were placed in this encounter.  Medication Changes: No orders of the defined types were placed in this encounter.

## 2021-08-07 NOTE — Patient Instructions (Signed)
Medication Instructions:  Your physician recommends that you continue on your current medications as directed. Please refer to the Current Medication list given to you today.  *If you need a refill on your cardiac medications before your next appointment, please call your pharmacy*   Lab Work: None If you have labs (blood work) drawn today and your tests are completely normal, you will receive your results only by: [place] (if you have MyChart) OR A paper copy in the mail If you have any lab test that is abnormal or we need to change your treatment, we will call you to review the results.   Testing/Procedures: None   Follow-Up: At [facility], you and your health needs are our priority.  As part of our continuing mission to provide you with exceptional heart care, we have created designated Provider Care Teams.  These Care Teams include your primary Cardiologist (physician) and Advanced Practice Providers (APPs -  Physician Assistants and Nurse Practitioners) who all work together to provide you with the care you need, when you need it.  We recommend signing up for the patient portal called "MyChart".  Sign up information is provided on this After Visit Summary.  MyChart is used to connect with patients for Virtual Visits (Telemedicine).  Patients are able to view lab/test results, encounter notes, upcoming appointments, etc.  Non-urgent messages can be sent to your provider as well.   To learn more about what you can do with MyChart, go to [email or web address].    Your next appointment:   Second week in April  The format for your next appointment:   In Person  Provider:   Berniece Salines  [facility] [address], [place], [place] [address]     Other Instructions

## 2021-08-09 DIAGNOSIS — O09513 Supervision of elderly primigravida, third trimester: Secondary | ICD-10-CM | POA: Insufficient documentation

## 2021-08-09 DIAGNOSIS — I471 Supraventricular tachycardia, unspecified: Secondary | ICD-10-CM | POA: Insufficient documentation

## 2021-08-14 ENCOUNTER — Encounter: Payer: Managed Care, Other (non HMO) | Admitting: Obstetrics and Gynecology

## 2021-08-14 ENCOUNTER — Other Ambulatory Visit: Payer: Managed Care, Other (non HMO)

## 2021-08-17 ENCOUNTER — Encounter: Payer: Self-pay | Admitting: Cardiology

## 2021-08-17 ENCOUNTER — Encounter: Payer: Self-pay | Admitting: Obstetrics & Gynecology

## 2021-08-17 NOTE — Telephone Encounter (Signed)
Can she do this? OR does she just need to do the 3 hour?

## 2021-08-18 ENCOUNTER — Encounter: Payer: Managed Care, Other (non HMO) | Admitting: Obstetrics & Gynecology

## 2021-08-18 ENCOUNTER — Other Ambulatory Visit: Payer: Managed Care, Other (non HMO)

## 2021-08-18 ENCOUNTER — Encounter: Payer: Managed Care, Other (non HMO) | Admitting: Obstetrics

## 2021-08-20 ENCOUNTER — Ambulatory Visit (INDEPENDENT_AMBULATORY_CARE_PROVIDER_SITE_OTHER): Payer: Managed Care, Other (non HMO) | Admitting: Obstetrics and Gynecology

## 2021-08-20 ENCOUNTER — Encounter: Payer: Self-pay | Admitting: Obstetrics and Gynecology

## 2021-08-20 ENCOUNTER — Other Ambulatory Visit: Payer: Managed Care, Other (non HMO)

## 2021-08-20 ENCOUNTER — Other Ambulatory Visit: Payer: Self-pay

## 2021-08-20 VITALS — BP 128/74 | Wt 322.0 lb

## 2021-08-20 DIAGNOSIS — Z131 Encounter for screening for diabetes mellitus: Secondary | ICD-10-CM

## 2021-08-20 DIAGNOSIS — Z23 Encounter for immunization: Secondary | ICD-10-CM

## 2021-08-20 DIAGNOSIS — O09513 Supervision of elderly primigravida, third trimester: Secondary | ICD-10-CM

## 2021-08-20 DIAGNOSIS — O43199 Other malformation of placenta, unspecified trimester: Secondary | ICD-10-CM | POA: Insufficient documentation

## 2021-08-20 DIAGNOSIS — O0990 Supervision of high risk pregnancy, unspecified, unspecified trimester: Secondary | ICD-10-CM

## 2021-08-20 DIAGNOSIS — O99210 Obesity complicating pregnancy, unspecified trimester: Secondary | ICD-10-CM

## 2021-08-20 NOTE — Progress Notes (Signed)
° °  PRENATAL VISIT NOTE  Subjective:  Kristina Mccall is a 36 y.o. G1P0000 at [redacted]w[redacted]d being seen today for ongoing prenatal care.  She is currently monitored for the following issues for this high-risk pregnancy and has Decreased libido; Anxiety and depression; History of cervical dysplasia; Obesity affecting pregnancy in first trimester; Supervision of high risk pregnancy, antepartum; BMI 50.0-59.9, adult ([place]); Obesity in pregnancy; PSVT (paroxysmal supraventricular tachycardia) ([place]); Advanced maternal age, 1st pregnancy, third trimester; and Marginal insertion of umbilical cord affecting management of mother on their problem list.  Patient reports no complaints.  Contractions: Not present. Vag. Bleeding: None.  Movement: Present. Denies leaking of fluid.   The following portions of the patient's history were reviewed and updated as appropriate: allergies, current medications, past family history, past medical history, past social history, past surgical history and problem list.   Objective:   Vitals:   08/20/21 0840  BP: 128/74  Weight: (!) 322 lb (146.1 kg)    Fetal Status: Fetal Heart Rate (bpm): 135 Fundal Height: 29 cm Movement: Present     General:  Alert, oriented and cooperative. Patient is in no acute distress.  Skin: Skin is warm and dry. No rash noted.   Cardiovascular: Normal heart rate noted  Respiratory: Normal respiratory effort, no problems with respiration noted  Abdomen: Soft, gravid, appropriate for gestational age.  Pain/Pressure: Absent     Pelvic: Cervical exam deferred        Extremities: Normal range of motion.     Mental Status: Normal mood and affect. Normal behavior. Normal judgment and thought content.   Assessment and Plan:  Pregnancy: G1P0000 at [redacted]w[redacted]d 1. Supervision of high risk pregnancy, antepartum Patient is doing well without complaints Tdap, glucola and third trimester labs today Patient undecided on pediatrician Patient desires IUD for  contraception  2. Obesity in pregnancy Continue ASA TWG 5lb thus far  3. Advanced maternal age, 1st pregnancy, third trimester   4. Marginal insertion of umbilical cord affecting management of mother Follow up growth per MFM  Preterm labor symptoms and general obstetric precautions including but not limited to vaginal bleeding, contractions, leaking of fluid and fetal movement were reviewed in detail with the patient. Please refer to After Visit Summary for other counseling recommendations.   Return in about 2 weeks (around 09/03/2021) for in person, ROB, High risk.  Future Appointments  Date Time Provider [place]  09/09/2021  2:30 PM Harlin Heys, MD [facility] None  09/10/2021  4:00 PM [facility]  10/07/2021 10:20 AM Tobb, Godfrey Pick, DO [facility]    Mora Bellman, MD

## 2021-08-20 NOTE — Progress Notes (Signed)
No vb. No lof. 28 week labs today.  

## 2021-08-21 ENCOUNTER — Other Ambulatory Visit: Payer: Managed Care, Other (non HMO)

## 2021-08-21 ENCOUNTER — Encounter: Payer: Managed Care, Other (non HMO) | Admitting: Advanced Practice Midwife

## 2021-08-21 LAB — 28 WEEK RH+PANEL
Basophils Absolute: 0 10*3/uL (ref 0.0–0.2)
Basos: 0 %
EOS (ABSOLUTE): 0.2 10*3/uL (ref 0.0–0.4)
Eos: 2 %
Gestational Diabetes Screen: 119 mg/dL (ref 70–139)
HIV Screen 4th Generation wRfx: NONREACTIVE
Hematocrit: 39.1 % (ref 34.0–46.6)
Hemoglobin: 13.1 g/dL (ref 11.1–15.9)
Immature Grans (Abs): 0.1 10*3/uL (ref 0.0–0.1)
Immature Granulocytes: 1 %
Lymphocytes Absolute: 2 10*3/uL (ref 0.7–3.1)
Lymphs: 19 %
MCH: 29.6 pg (ref 26.6–33.0)
MCHC: 33.5 g/dL (ref 31.5–35.7)
MCV: 89 fL (ref 79–97)
Monocytes Absolute: 0.6 10*3/uL (ref 0.1–0.9)
Monocytes: 6 %
Neutrophils Absolute: 7.6 10*3/uL — ABNORMAL HIGH (ref 1.4–7.0)
Neutrophils: 72 %
Platelets: 371 10*3/uL (ref 150–450)
RBC: 4.42 x10E6/uL (ref 3.77–5.28)
RDW: 12.4 % (ref 11.7–15.4)
RPR Ser Ql: NONREACTIVE
WBC: 10.5 10*3/uL (ref 3.4–10.8)

## 2021-08-26 ENCOUNTER — Other Ambulatory Visit: Payer: Managed Care, Other (non HMO)

## 2021-08-26 ENCOUNTER — Encounter: Payer: Managed Care, Other (non HMO) | Admitting: Obstetrics & Gynecology

## 2021-08-27 ENCOUNTER — Encounter: Payer: Self-pay | Admitting: Obstetrics & Gynecology

## 2021-09-02 ENCOUNTER — Encounter: Payer: Managed Care, Other (non HMO) | Admitting: Obstetrics

## 2021-09-02 ENCOUNTER — Other Ambulatory Visit: Payer: Self-pay

## 2021-09-07 ENCOUNTER — Encounter: Payer: Managed Care, Other (non HMO) | Admitting: Obstetrics

## 2021-09-08 ENCOUNTER — Other Ambulatory Visit: Payer: Self-pay

## 2021-09-08 DIAGNOSIS — O43192 Other malformation of placenta, second trimester: Secondary | ICD-10-CM

## 2021-09-08 DIAGNOSIS — O43199 Other malformation of placenta, unspecified trimester: Secondary | ICD-10-CM

## 2021-09-08 DIAGNOSIS — O09513 Supervision of elderly primigravida, third trimester: Secondary | ICD-10-CM

## 2021-09-08 DIAGNOSIS — O99210 Obesity complicating pregnancy, unspecified trimester: Secondary | ICD-10-CM

## 2021-09-09 ENCOUNTER — Other Ambulatory Visit: Payer: Self-pay

## 2021-09-09 ENCOUNTER — Encounter: Payer: Self-pay | Admitting: Obstetrics and Gynecology

## 2021-09-09 ENCOUNTER — Ambulatory Visit: Payer: Managed Care, Other (non HMO) | Admitting: Obstetrics and Gynecology

## 2021-09-09 VITALS — BP 128/82 | HR 90 | Ht 63.0 in | Wt 332.1 lb

## 2021-09-09 DIAGNOSIS — Z7689 Persons encountering health services in other specified circumstances: Secondary | ICD-10-CM

## 2021-09-09 DIAGNOSIS — O43199 Other malformation of placenta, unspecified trimester: Secondary | ICD-10-CM

## 2021-09-09 DIAGNOSIS — O0990 Supervision of high risk pregnancy, unspecified, unspecified trimester: Secondary | ICD-10-CM | POA: Diagnosis not present

## 2021-09-09 DIAGNOSIS — Z3A32 32 weeks gestation of pregnancy: Secondary | ICD-10-CM

## 2021-09-09 DIAGNOSIS — O0993 Supervision of high risk pregnancy, unspecified, third trimester: Secondary | ICD-10-CM | POA: Diagnosis not present

## 2021-09-09 LAB — POCT URINALYSIS DIPSTICK OB
Bilirubin, UA: NEGATIVE
Blood, UA: NEGATIVE
Glucose, UA: NEGATIVE
Ketones, UA: NEGATIVE
Leukocytes, UA: NEGATIVE
Nitrite, UA: NEGATIVE
POC,PROTEIN,UA: NEGATIVE
Spec Grav, UA: 1.02 (ref 1.010–1.025)
Urobilinogen, UA: 0.2 E.U./dL
pH, UA: 6 (ref 5.0–8.0)

## 2021-09-09 NOTE — Progress Notes (Signed)
= ?  Patient presents today to transfer her prenatal care. She states she is having fetal movement along with pain/pressure. Patient states no other questions or concerns. ?

## 2021-09-09 NOTE — Progress Notes (Signed)
HPI: ?     [name] is a 36 y.o. G1P0000 who LMP was Patient's last menstrual period was 01/13/2021. ? ?Subjective:  ? ?She presents today to establish care in the third trimester of her pregnancy.  She is approximately 32 weeks estimated gestational age.  She reports daily fetal movement.  Her pregnancy is complicated by advanced maternal age, anxiety and depression, elevated BMI (59), heart palpitations-patient is seeing cardiologist (PSVT),  marginal insertion of umbilical cord. ? ?  Hx: ?The following portions of the patient's history were reviewed and updated as appropriate: ?            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 ?She does not have any pertinent problems on file. ?She  has a past surgical history that includes Colposcopy (2009/2010) and Intrauterine device (iud) insertion (10/24/2013). ?Her family history includes Cancer in her mother; Diabetes in her maternal grandmother; Heart disease in her maternal grandfather; Hyperlipidemia in her father and mother; Hypertension in her mother. ?She  reports that she has never smoked. She has never used smokeless tobacco. She reports that she does not currently use alcohol. She reports that she does not use drugs. ?She has a current medication list which includes the following prescription(s): aspirin ec and prenatal multivitamin. ?She is allergic to amoxicillin, ciprofloxacin, doxycycline, hydrocodone, methylprednisolone sodium succ, other, penicillins, and latex. ?      ?Review of Systems:  ?Review of Systems ? ?Constitutional: Denied constitutional symptoms, night sweats, recent illness, fatigue, fever, insomnia and weight  loss.  ?Eyes: Denied eye symptoms, eye pain, photophobia, vision change and visual disturbance.  ?Ears/Nose/Throat/Neck: Denied ear, nose, throat or neck symptoms, hearing loss, nasal discharge, sinus congestion and sore throat.  ?Cardiovascular: Denied cardiovascular symptoms, arrhythmia, chest pain/pressure, edema, exercise intolerance, orthopnea and palpitations.  ?Respiratory: Denied pulmonary symptoms, asthma, pleuritic pain, productive sputum, cough, dyspnea and wheezing.  ?Gastrointestinal: Denied, gastro-esophageal reflux, melena, nausea and vomiting.  ?Genitourinary: Denied genitourinary symptoms including symptomatic vaginal discharge, pelvic relaxation issues, and urinary complaints.  ?Musculoskeletal: Denied musculoskeletal symptoms, stiffness, swelling, muscle weakness and myalgia.  ?Dermatologic: Denied dermatology symptoms, rash and scar.  ?Neurologic: Denied neurology symptoms, dizziness, headache, neck pain and syncope.  ?Psychiatric: Denied psychiatric symptoms, anxiety and depression.  ?Endocrine: Denied endocrine symptoms including hot flashes and night sweats.  ? ?Meds: ?  ?Current Outpatient Medications on File Prior to Visit  ?Medication Sig Dispense Refill  ? aspirin EC 81 MG tablet Take 81 mg by mouth daily. Swallow whole.    ? Prenatal Vit-Fe Fumarate-FA (PRENATAL MULTIVITAMIN) TABS tablet Take 1 tablet by mouth daily at 12 noon.    ? ?No current facility-administered medications on file prior to visit.  ? ? ? ? ?Objective:  ?  ? ?Vitals:  ? 09/09/21 1438  ?BP: 128/82  ?Pulse: 90  ? ?Filed Weights  ? 09/09/21 1438  ?Weight: (!) 332 lb 1.6 oz (150.6 kg)  ? ?  ?         Fetal heart rate 141 baby moving during Doppler ? Fundal height - NA ?        ? ?Assessment:  ?  ?G1P0000 ?Patient Active Problem List  ? Diagnosis Date Noted  ? Marginal insertion of umbilical cord affecting management of mother  08/20/2021  ? PSVT (paroxysmal supraventricular tachycardia) ([facility]) 08/09/2021  ? Advanced maternal  age, 1st pregnancy, third trimester 08/09/2021  ? Obesity in pregnancy 06/12/2021  ? BMI 50.0-59.9, adult ([facility]) 04/27/2021  ? Obesity affecting pregnancy in first trimester 03/13/2021  ? Supervision of high risk pregnancy, antepartum 03/13/2021  ? History of cervical dysplasia 08/18/2020  ? Decreased libido 05/29/2018  ? Anxiety and depression 05/29/2018  ? ?  ?1. Encounter to establish care   ?2. Supervision of high risk pregnancy, antepartum   ?3. High-risk pregnancy, third trimester   ?4. [redacted] weeks gestation of pregnancy   ?5. Marginal insertion of umbilical cord affecting management of mother   ? ?  ? ? ?Plan:  ?  ?       ? 1.  Continue MFM consult as scheduled ? 2.  Anesthesia reconsult as previously directed (scheduled today) ? 3.  Begin NSTs at 36 weeks ? 4.  Induction at 39 weeks for elevated BMI ? ?Orders ?Orders Placed This Encounter  ?Procedures  ? POC Urinalysis Dipstick OB  ? ? No orders of the defined types were placed in this encounter. ?  ?  F/U ? Return in about 2 weeks (around 09/23/2021) for [facility]. ?I spent 35 minutes involved in the care of this patient preparing to see the patient by obtaining and reviewing her medical history (including labs, imaging tests and prior procedures), documenting clinical information in the electronic health record (EHR), counseling and coordinating care plans, writing and sending prescriptions, ordering tests or procedures and in direct communicating with the patient and medical staff discussing pertinent items from her history and physical exam. ? ?Elonda Husky, M.D. ?09/09/2021 ?3:14 PM ? ? ? ?

## 2021-09-10 ENCOUNTER — Encounter: Payer: Self-pay | Admitting: Obstetrics and Gynecology

## 2021-09-10 ENCOUNTER — Other Ambulatory Visit: Payer: Self-pay

## 2021-09-10 ENCOUNTER — Ambulatory Visit: Payer: Managed Care, Other (non HMO) | Attending: Obstetrics

## 2021-09-10 VITALS — BP 103/63 | HR 78 | Temp 97.5°F | Ht 63.0 in | Wt 331.5 lb

## 2021-09-10 DIAGNOSIS — Z3A32 32 weeks gestation of pregnancy: Secondary | ICD-10-CM

## 2021-09-10 DIAGNOSIS — O09513 Supervision of elderly primigravida, third trimester: Secondary | ICD-10-CM

## 2021-09-10 DIAGNOSIS — O43193 Other malformation of placenta, third trimester: Secondary | ICD-10-CM | POA: Diagnosis not present

## 2021-09-10 DIAGNOSIS — O99210 Obesity complicating pregnancy, unspecified trimester: Secondary | ICD-10-CM

## 2021-09-10 DIAGNOSIS — E669 Obesity, unspecified: Secondary | ICD-10-CM

## 2021-09-14 ENCOUNTER — Encounter
Admission: RE | Admit: 2021-09-14 | Discharge: 2021-09-14 | Disposition: A | Discharge status: Home / Self Care | Payer: Managed Care, Other (non HMO) | Source: Ambulatory Visit | Attending: Anesthesiology | Admitting: Anesthesiology

## 2021-09-14 ENCOUNTER — Other Ambulatory Visit: Payer: Self-pay

## 2021-09-16 ENCOUNTER — Encounter: Payer: Self-pay | Admitting: Anesthesiology

## 2021-09-16 NOTE — H&P (Signed)
Pt seen is anesthesia preop clinic. Risks and benefits regarding morbid obesity discussed with patient. Potential difficulty of epidural placement and airway mgmt reviewed ? ?Grade 4 view OP noted ? ?JA ?

## 2021-09-24 ENCOUNTER — Encounter: Payer: Self-pay | Admitting: Obstetrics and Gynecology

## 2021-09-29 ENCOUNTER — Encounter: Payer: Self-pay | Admitting: Obstetrics and Gynecology

## 2021-09-29 ENCOUNTER — Ambulatory Visit (INDEPENDENT_AMBULATORY_CARE_PROVIDER_SITE_OTHER): Payer: Managed Care, Other (non HMO) | Admitting: Obstetrics and Gynecology

## 2021-09-29 ENCOUNTER — Other Ambulatory Visit: Payer: Managed Care, Other (non HMO)

## 2021-09-29 VITALS — BP 132/93 | HR 113 | Wt 332.6 lb

## 2021-09-29 DIAGNOSIS — O0993 Supervision of high risk pregnancy, unspecified, third trimester: Secondary | ICD-10-CM | POA: Diagnosis not present

## 2021-09-29 DIAGNOSIS — Z3A35 35 weeks gestation of pregnancy: Secondary | ICD-10-CM

## 2021-09-29 DIAGNOSIS — Z6841 Body Mass Index (BMI) 40.0 and over, adult: Secondary | ICD-10-CM

## 2021-09-29 DIAGNOSIS — O09513 Supervision of elderly primigravida, third trimester: Secondary | ICD-10-CM

## 2021-09-29 LAB — FETAL NONSTRESS TEST

## 2021-09-29 LAB — POCT URINALYSIS DIPSTICK OB
Bilirubin, UA: NEGATIVE
Blood, UA: NEGATIVE
Glucose, UA: NEGATIVE
Ketones, UA: NEGATIVE
Nitrite, UA: NEGATIVE
Spec Grav, UA: 1.02 (ref 1.010–1.025)
Urobilinogen, UA: 0.2 E.U./dL
pH, UA: 6 (ref 5.0–8.0)

## 2021-09-29 NOTE — Patient Instructions (Signed)
Fetal Positions ?In the final weeks of your pregnancy, your baby usually moves into a head-down (vertex) position to get ready for birth. As a normal delivery proceeds through the stages of labor, the baby tucks in the chin and turns to face your back. In this position, the back of your baby's head starts to show (crown) first through your open cervix. Sometimes your baby may be in a different, abnormal position just before birth. These positions are called malpositions or malpresentations. Giving birth can be more difficult if your baby is in an abnormal position. ?Abnormal fetal positions ?There are five main abnormal fetal positions: ?Occiput posterior presentation. This is the most common abnormal fetal position. It is sometimes called the sunny-side up position because your baby's face points toward your front instead of your back. ?Breech presentation. This is also common. In this position, your baby's bottom or feet are in position to come out first. ?Face or brow presentation. In this position, your baby is head down, but the face or the front of the head crowns first. ?Compound presentation. In this position, your baby's hand or leg comes out along with the head or bottom. ?Transverse presentation. In this position, your baby is lying sideways across your birth canal. Your baby's shoulder may come out first. ?Your health care provider can diagnose an abnormal fetal position during a physical exam as your due date approaches. An abnormal fetal position may be found by feeling your belly and by doing an internal (pelvic) exam. A sound wave imaging study (fetal ultrasound) can be done to confirm the abnormal position. ?What causes an abnormal fetal position? ?In many cases, the cause for an abnormal fetal position is not known. You may be at higher risk of having a baby in an abnormal fetal position if: ?You have an abnormally shaped womb (uterus) or pelvis. ?You have growths in your uterus, such as  fibroids. ?Your placenta is large or in an abnormal position. ?You are having twins or multiples. ?You have too much or too little amniotic fluid. ?Your baby has some type of developmental abnormality. ?You go into early (premature) labor. ?How does this affect me? ?In some cases, your baby may abruptly move into a vertex position just before or during labor. However, an abnormal fetal position increases your risk for a long labor or the need for steps to be taken to help ensure a safe delivery. Your health care provider may need to: ?Turn the baby manually by pushing on your belly (external cephalic version). ?Use instruments, such as forceps or a suctioning device, to help get your baby through the birth canal (assisted delivery). ?Deliver your baby by cesarean delivery, also called a C-section. ?The exact effects on your delivery will depend on the position your baby is in right before birth. ?If your baby has an occiput posterior presentation: ?You may have to push longer and may have a longer labor. ?You may have more back pain. ?You may deliver vaginally, but you may also need an assisted delivery using forceps or vacuum device. ?You may need a cesarean delivery. ?If your baby is breech: ?Your health care provider may try a vaginal delivery. However, in most cases your health care provider will recommend a cesarean delivery to safely deliver your baby. ?If your baby is in a face or brow presentation: ?Your labor may be longer. ?You may be able to have a vaginal or assisted vaginal delivery. ?There is a higher-than-normal risk that you will need a cesarean delivery. ?  If your baby is in a compound presentation: ?Your health care provider may be able to change your baby's position manually. ?In most cases, this position requires a cesarean delivery. ?If your baby is in a transverse presentation: ?Your health care provider may be able to turn your baby manually. ?In most cases, this position requires a cesarean  delivery. ?How does this affect my baby? ?Most babies are not affected by an abnormal fetal position, but there is a higher risk of some complications, including: ?Swelling and bruising. ?Birth injuries. ?Not getting enough oxygen during birth. ?Summary ?The normal fetal position for birth is head down and facing toward your back (vertex position). ?Abnormal fetal positions include occiput posterior, breech, face or brow presentation, compound presentation, and transverse position. ?In some cases, your baby may abruptly move into a normal position before birth, or your health care provider may be able to change your baby's position manually. ?If your baby is in an abnormal fetal position at the time of birth, you have a greater risk of a longer labor, assisted delivery, or a cesarean delivery. ?This information is not intended to replace advice given to you by your health care provider. Make sure you discuss any questions you have with your health care provider. ?Document Revised: 03/31/2020 Document Reviewed: 03/31/2020 ?Elsevier Patient Education ? [place]. ? ?

## 2021-09-29 NOTE — Progress Notes (Signed)
ROB: Patient doing well, no major issues.  Revisited discussion of IOL at 39 weeks. For 36 week cultures next visit. Continue monthly scans with MFM. Notes last [place] had breech presentation. If remains breech on next scan, discussed possible interventions for rotation. Patient notes that she would decline ECV. Borderline BP noted today, continue to monitor.  NST performed today was reviewed and was found to be reactive.  Continue recommended antenatal testing and prenatal care. ? ? ?NONSTRESS TEST INTERPRETATION ? ?INDICATIONS: Obesity ? ?FHR baseline: 145 bpm.   ?RESULTS:Reactive ?COMMENTS: No contractions ? ?PLAN: ?1. Continue fetal kick counts twice a day. ?2. Continue antepartum testing as scheduled-weekly ? ? ? ?

## 2021-09-29 NOTE — Progress Notes (Signed)
She is doing well. She has no new concerns today. ?

## 2021-10-06 ENCOUNTER — Other Ambulatory Visit: Payer: Self-pay

## 2021-10-06 ENCOUNTER — Ambulatory Visit (INDEPENDENT_AMBULATORY_CARE_PROVIDER_SITE_OTHER): Payer: Managed Care, Other (non HMO) | Admitting: Obstetrics and Gynecology

## 2021-10-06 ENCOUNTER — Other Ambulatory Visit: Payer: Managed Care, Other (non HMO)

## 2021-10-06 ENCOUNTER — Encounter: Payer: Self-pay | Admitting: Obstetrics and Gynecology

## 2021-10-06 VITALS — BP 127/94 | HR 114 | Wt 333.7 lb

## 2021-10-06 DIAGNOSIS — O0993 Supervision of high risk pregnancy, unspecified, third trimester: Secondary | ICD-10-CM

## 2021-10-06 DIAGNOSIS — F419 Anxiety disorder, unspecified: Secondary | ICD-10-CM

## 2021-10-06 DIAGNOSIS — Z3685 Encounter for antenatal screening for Streptococcus B: Secondary | ICD-10-CM

## 2021-10-06 DIAGNOSIS — Z3A36 36 weeks gestation of pregnancy: Secondary | ICD-10-CM

## 2021-10-06 DIAGNOSIS — Z113 Encounter for screening for infections with a predominantly sexual mode of transmission: Secondary | ICD-10-CM

## 2021-10-06 DIAGNOSIS — O09513 Supervision of elderly primigravida, third trimester: Secondary | ICD-10-CM

## 2021-10-06 DIAGNOSIS — O43199 Other malformation of placenta, unspecified trimester: Secondary | ICD-10-CM

## 2021-10-06 DIAGNOSIS — O99210 Obesity complicating pregnancy, unspecified trimester: Secondary | ICD-10-CM

## 2021-10-06 LAB — FETAL NONSTRESS TEST

## 2021-10-06 LAB — POCT URINALYSIS DIPSTICK OB
Bilirubin, UA: NEGATIVE
Blood, UA: NEGATIVE
Glucose, UA: NEGATIVE
Ketones, UA: NEGATIVE
Nitrite, UA: NEGATIVE
Spec Grav, UA: 1.02 (ref 1.010–1.025)
Urobilinogen, UA: 0.2 E.U./dL
pH, UA: 6 (ref 5.0–8.0)

## 2021-10-06 LAB — OB RESULTS CONSOLE GC/CHLAMYDIA: Gonorrhea: NEGATIVE

## 2021-10-06 NOTE — Progress Notes (Signed)
ROB. Patient states fetal movement with pelvic and back pain. She is having a NST today. GC/CT and GBS cultures ordered. Patient states no questions or concerns at this time.  ? ?

## 2021-10-06 NOTE — Progress Notes (Signed)
ROB: No complaints.  NST today reactive.  Baby likely breech by location of fetal monitor and patient history.  Scheduled for ultrasound in 2 days.  Patient not interested in external cephalic version.  If still breech schedule cesarean at next visit.  Patient is much more interested in " controlled circumstances than chaos."  GC/CT-GBS performed today. ?

## 2021-10-07 ENCOUNTER — Encounter: Payer: Self-pay | Admitting: Cardiology

## 2021-10-07 ENCOUNTER — Ambulatory Visit (INDEPENDENT_AMBULATORY_CARE_PROVIDER_SITE_OTHER): Payer: Managed Care, Other (non HMO) | Admitting: Cardiology

## 2021-10-07 VITALS — BP 124/94 | HR 102 | Ht 63.0 in | Wt 330.8 lb

## 2021-10-07 DIAGNOSIS — O09513 Supervision of elderly primigravida, third trimester: Secondary | ICD-10-CM | POA: Diagnosis not present

## 2021-10-07 DIAGNOSIS — O0990 Supervision of high risk pregnancy, unspecified, unspecified trimester: Secondary | ICD-10-CM

## 2021-10-07 DIAGNOSIS — Z719 Counseling, unspecified: Secondary | ICD-10-CM | POA: Diagnosis not present

## 2021-10-07 DIAGNOSIS — I471 Supraventricular tachycardia: Secondary | ICD-10-CM | POA: Diagnosis not present

## 2021-10-07 DIAGNOSIS — O99210 Obesity complicating pregnancy, unspecified trimester: Secondary | ICD-10-CM

## 2021-10-07 NOTE — Patient Instructions (Signed)
Medication Instructions:  ?Your physician recommends that you continue on your current medications as directed. Please refer to the Current Medication list given to you today.  ?*If you need a refill on your cardiac medications before your next appointment, please call your pharmacy* ? ? ?Lab Work: ?None ?If you have labs (blood work) drawn today and your tests are completely normal, you will receive your results only by: ?MyChart Message (if you have MyChart) OR ?A paper copy in the mail ?If you have any lab test that is abnormal or we need to change your treatment, we will call you to review the results. ? ? ?Testing/Procedures: ?None ? ? ?Follow-Up: ?At [facility], you and your health needs are our priority.  As part of our continuing mission to provide you with exceptional heart care, we have created designated Provider Care Teams.  These Care Teams include your primary Cardiologist (physician) and Advanced Practice Providers (APPs -  Physician Assistants and Nurse Practitioners) who all work together to provide you with the care you need, when you need it. ? ?We recommend signing up for the patient portal called "MyChart".  Sign up information is provided on this After Visit Summary.  MyChart is used to connect with patients for Virtual Visits (Telemedicine).  Patients are able to view lab/test results, encounter notes, upcoming appointments, etc.  Non-urgent messages can be sent to your provider as well.   ?To learn more about what you can do with MyChart, go to [email or web address].   ? ?Your next appointment:   ?8 week(s) ? ?The format for your next appointment:   ?Virtual Visit  ? ?Provider:   ?Kardie Tobb, DO   ? ? ?Other Instructions ? ? ?Important Information About [place] ? ? ? ? ?  ?

## 2021-10-07 NOTE — Progress Notes (Signed)
?[facility] ? ?Follow Up Note ? ? ?Date:  10/07/2021  ? ?ID:  Kristina Mccall, DOB Jun 20, 1986, MRN [id number] ? ?PCP:  Patient, No Pcp Per (Inactive) ?  ?[facility] Providers ?Cardiologist:  Berniece Salines, DO  ?Electrophysiologist:  None      ? ? ?Referring MD: No ref. provider found  ? ?Chief Complaint: " I am ok" ? ?History of Present Illness:   ? ?Kristina Mccall is a 36 y.o. female [G1P0000] who returns for follow up shortness of breath. ? ?Patient has medical history of anxiety, depression, obesity.  She is referred today for cardiac obstetric clinic.  Since being last seen she had Zio patch placed which showed infrequent episodes of SVT.  She reports that she would press the button when she was feeling these palpitations and they did not correlate with when the episodes of SVT were occurring.  She will check her heart rate at these times and it would typically be in the 80s.  She reports that prior to pregnancy her heart rate was typically around 50-60 bpm.  She was having these episodes of heart palpitations prior to pregnancy but they are more frequent now. ? ?I saw the patient on August 07, 2021 at that time we discussed a ZIO monitor.  There was also concern with her OB practice transitioning of care.  Which thankfully has been resolved.  She has been having some runs of breath but not worsening.  Thankfully she is doing well overall. ? ? ?Prior CV Studies Reviewed: ?The following studies were reviewed today: ? ?Patch Wear Time:  13 days and 21 hours (2022-12-16T14:11:44-0500 to 2022-12-30T11:26:00-0500) ?  ?Patient had a min HR of 56 bpm, max HR of 190 bpm, and avg HR of 88 bpm. Predominant underlying rhythm was Sinus Rhythm. ?  ?3 Supraventricular Tachycardia runs occurred, the run with the fastest interval lasting 13 beats with a max rate of 190 bpm (avg 161  ?bpm); the run with the fastest interval was also the longest. Isolated SVEs were rare (<1.0%), SVE Couplets were rare (<1.0%), and  SVE Triplets were rare (<1.0%). Isolated VEs were rare (<1.0%), and no VE Couplets or VE Triplets were present. Ventricular ? Bigeminy and Trigeminy were present.  ?  ?Symptoms associated with sinus rhythm and rare premature atrial complexes. ?  ?Conclusion: This study is remarkable for rare paroxysmal supraventricular tachycardia. ?  ?Past Medical History:  ?Diagnosis Date  ? Abnormal Pap smear of cervix 05/28/2008;01/29/08;11/27/2012  ? Arthritis   ? Bacterial vaginosis   ? Condyloma   ? Degenerative disc disease, lumbar 06/2010  ? Depression 2013  ? History of Papanicolaou smear of cervix 04/26/2014  ? -/-; 04/29/2015 neg  ? 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 positive high risk  ? LGSIL on Pap smear of cervix 03/18/2009  ? lgsil/hpv present  ? ? ?Past Surgical History:  ?Procedure Laterality Date  ? COLPOSCOPY  2009/2010  ? no bx done  ? INTRAUTERINE DEVICE (IUD) INSERTION  10/24/2013  ?   ? ?OB History   ? ? Gravida  ?1  ? Para  ?0  ? Term  ?0  ? Preterm  ?0  ? AB  ?0  ? Living  ?0  ?  ? ? SAB  ?0  ? IAB  ?0  ? Ectopic  ?0  ? Multiple  ?0  ? Live Births  ?0  ?   ?  ?  ?    ? ? ?Current  Medications: ?Current Meds  ?Medication Sig  ? aspirin EC 81 MG tablet Take 81 mg by mouth daily. Swallow whole.  ? cefdinir (OMNICEF) 300 MG capsule Take 300 mg by mouth 2 (two) times daily.  ? Prenatal Vit-Fe Fumarate-FA (PRENATAL MULTIVITAMIN) TABS tablet Take 1 tablet by mouth daily at 12 noon.  ?  ? ?Allergies:   Amoxicillin, Ciprofloxacin, Doxycycline, Hydrocodone, Methylprednisolone sodium succ, Other, Penicillins, and Latex  ? ?Social History  ? ?Socioeconomic History  ? Marital status: Married  ?  Spouse name: Not on file  ? Number of children: Not on file  ? Years of education: Not on file  ? Highest education level: Not on file  ?Occupational History  ? Occupation: Urgent Care  ?Tobacco Use  ? Smoking status: Never  ? Smokeless tobacco: Never  ?Vaping Use  ? Vaping Use: Never used  ?Substance  and Sexual Activity  ? Alcohol use: Not Currently  ? Drug use: No  ? Sexual activity: Yes  ?Other Topics Concern  ? Not on file  ?Social History Narrative  ? Not on file  ? ?Social Determinants of Health  ? ?Financial Resource Strain: Not on file  ?Food Insecurity: No Food Insecurity  ? Worried About Charity fundraiser in the Last Year: Never true  ? Ran Out of Food in the Last Year: Never true  ?Transportation Needs: No Transportation Needs  ? Lack of Transportation (Medical): No  ? Lack of Transportation (Non-Medical): No  ?Physical Activity: Not on file  ?Stress: Not on file  ?Social Connections: Not on file  ?  ? ? ?Family History  ?Problem Relation Age of Onset  ? Cancer Mother   ?     cervical  ? Hyperlipidemia Mother   ? Hypertension Mother   ? Hyperlipidemia Father   ? Diabetes Maternal Grandmother   ? Heart disease Maternal Grandfather   ?   ? ?ROS:   ?Please see the history of present illness.    ? ?All other systems reviewed and are negative. ? ? ?Labs/EKG Reviewed:   ? ?EKG:   ?EKG is was not ordered today.   ? ?Recent Labs: ?08/20/2021: Hemoglobin 13.1; Platelets 371  ? ?Recent Lipid Panel ?Lab Results  ?Component Value Date/Time  ? CHOL 163 10/07/2017 08:57 AM  ? TRIG 56 10/07/2017 08:57 AM  ? HDL 58 10/07/2017 08:57 AM  ? CHOLHDL 2.8 10/07/2017 08:57 AM  ? LDLCALC 94 10/07/2017 08:57 AM  ? ? ?Physical Exam:   ? ?VS:  BP (!) 124/94   Pulse (!) 102   Ht 5\' 3"  (1.6 m)   Wt (!) 330 lb 12.8 oz (150 kg)   LMP 01/13/2021   SpO2 97%   BMI 58.60 kg/m?    ? ?Wt Readings from Last 3 Encounters:  ?10/07/21 (!) 330 lb 12.8 oz (150 kg)  ?10/06/21 (!) 333 lb 11.2 oz (151.4 kg)  ?09/29/21 (!) 332 lb 9.6 oz (150.9 kg)  ?  ? ?GEN:  Well nourished, well developed in no acute distress ?HEENT: Normal ?NECK: No JVD; No carotid bruits ?LYMPHATICS: No lymphadenopathy ?CARDIAC: RRR, no murmurs, rubs, gallops ?RESPIRATORY:  Clear to auscultation without rales, wheezing or rhonchi  ?ABDOMEN: Soft, non-tender,  non-distended ?MUSCULOSKELETAL:  No edema; No deformity  ?SKIN: Warm and dry ?NEUROLOGIC:  Alert and oriented x 3 ?PSYCHIATRIC:  Normal affect  ? ? ?Risk Assessment/Risk Calculators:   ?  ?CARPREG II ?Risk Prediction Index Score:  1.  The patient's risk for a  primary cardiac event is 5%. ?  ?   ?  ?  ? ? ?ASSESSMENT & PLAN:   ? ?Paroxysmal SVT ?Advanced maternal age ?Obesity in pregnancy ?High risk pregnancy ?Health education/counseling ? ?She has not had any repeated palpitations.  On that and she appears to be doing well.  She is looking forward to delivery. ?Her diastolic blood pressure slightly elevated in the office today.  I have asked the patient to take her blood pressure daily send me information if there is any reading over 140/90 mmHg.  We will limit salt intake. ? ?Continue her aspirin 35 m daily for preeclampsia prophylaxis she will discuss with OB when she should stop this medication. ? ? ?Patient Instructions  ?Medication Instructions:  ?Your physician recommends that you continue on your current medications as directed. Please refer to the Current Medication list given to you today.  ?*If you need a refill on your cardiac medications before your next appointment, please call your pharmacy* ? ? ?Lab Work: ?None ?If you have labs (blood work) drawn today and your tests are completely normal, you will receive your results only by: ?MyChart Message (if you have MyChart) OR ?A paper copy in the mail ?If you have any lab test that is abnormal or we need to change your treatment, we will call you to review the results. ? ? ?Testing/Procedures: ?None ? ? ?Follow-Up: ?At [facility], you and your health needs are our priority.  As part of our continuing mission to provide you with exceptional heart care, we have created designated Provider Care Teams.  These Care Teams include your primary Cardiologist (physician) and Advanced Practice Providers (APPs -  Physician Assistants and Nurse Practitioners) who  all work together to provide you with the care you need, when you need it. ? ?We recommend signing up for the patient portal called "MyChart".  Sign up information is provided on this After Visit Summary.  MyChart is [place]

## 2021-10-08 ENCOUNTER — Other Ambulatory Visit: Payer: Self-pay | Admitting: Obstetrics and Gynecology

## 2021-10-08 ENCOUNTER — Other Ambulatory Visit: Payer: Self-pay

## 2021-10-08 ENCOUNTER — Ambulatory Visit: Payer: Managed Care, Other (non HMO) | Attending: Obstetrics

## 2021-10-08 VITALS — BP 116/73 | HR 77 | Temp 97.7°F | Ht 63.0 in

## 2021-10-08 DIAGNOSIS — O99210 Obesity complicating pregnancy, unspecified trimester: Secondary | ICD-10-CM | POA: Diagnosis not present

## 2021-10-08 DIAGNOSIS — O99343 Other mental disorders complicating pregnancy, third trimester: Secondary | ICD-10-CM | POA: Diagnosis not present

## 2021-10-08 DIAGNOSIS — O43193 Other malformation of placenta, third trimester: Secondary | ICD-10-CM | POA: Diagnosis not present

## 2021-10-08 DIAGNOSIS — O43123 Velamentous insertion of umbilical cord, third trimester: Secondary | ICD-10-CM | POA: Insufficient documentation

## 2021-10-08 DIAGNOSIS — Z363 Encounter for antenatal screening for malformations: Secondary | ICD-10-CM | POA: Diagnosis present

## 2021-10-08 DIAGNOSIS — O09513 Supervision of elderly primigravida, third trimester: Secondary | ICD-10-CM

## 2021-10-08 DIAGNOSIS — O43199 Other malformation of placenta, unspecified trimester: Secondary | ICD-10-CM

## 2021-10-08 DIAGNOSIS — O0990 Supervision of high risk pregnancy, unspecified, unspecified trimester: Secondary | ICD-10-CM

## 2021-10-08 DIAGNOSIS — O99213 Obesity complicating pregnancy, third trimester: Secondary | ICD-10-CM | POA: Diagnosis not present

## 2021-10-08 DIAGNOSIS — F419 Anxiety disorder, unspecified: Secondary | ICD-10-CM

## 2021-10-08 DIAGNOSIS — Z3A36 36 weeks gestation of pregnancy: Secondary | ICD-10-CM

## 2021-10-08 DIAGNOSIS — F32A Depression, unspecified: Secondary | ICD-10-CM

## 2021-10-08 LAB — GC/CHLAMYDIA PROBE AMP
Chlamydia trachomatis, NAA: NEGATIVE
Neisseria Gonorrhoeae by PCR: NEGATIVE

## 2021-10-08 LAB — STREP GP B NAA: Strep Gp B NAA: NEGATIVE

## 2021-10-15 ENCOUNTER — Telehealth: Payer: Self-pay | Admitting: Obstetrics and Gynecology

## 2021-10-15 NOTE — Telephone Encounter (Signed)
Pt called and stated that she had been having some vision changes. States she was told last time that this happened to lay down and get rest and to drink plenty of water. Stated that her vision seemed to get better but she still "doesn't feel right". Stated very little fetal movement. Please advise.  ?

## 2021-10-15 NOTE — Telephone Encounter (Signed)
Spoke with patient regarding her concerns. She stated she had vision changes but after rest it has gotten better. Patients main concern is she states her baby's movement has decreased. She states per fetal movement count sh has been moving 10 times within 2 hours but states movements are not as strong as they have been. Advised patient to continue to stay hydrated, drink juice and how to correctly preform fetal counts. She voiced understanding and she stated she will be at her appointment tomorrow.  ?

## 2021-10-16 ENCOUNTER — Encounter: Payer: Self-pay | Admitting: Obstetrics and Gynecology

## 2021-10-16 ENCOUNTER — Ambulatory Visit (INDEPENDENT_AMBULATORY_CARE_PROVIDER_SITE_OTHER): Payer: Managed Care, Other (non HMO) | Admitting: Obstetrics and Gynecology

## 2021-10-16 VITALS — BP 128/81 | HR 71 | Wt 330.1 lb

## 2021-10-16 DIAGNOSIS — Z3A37 37 weeks gestation of pregnancy: Secondary | ICD-10-CM

## 2021-10-16 DIAGNOSIS — O09513 Supervision of elderly primigravida, third trimester: Secondary | ICD-10-CM

## 2021-10-16 DIAGNOSIS — O321XX0 Maternal care for breech presentation, not applicable or unspecified: Secondary | ICD-10-CM | POA: Insufficient documentation

## 2021-10-16 DIAGNOSIS — O43199 Other malformation of placenta, unspecified trimester: Secondary | ICD-10-CM

## 2021-10-16 DIAGNOSIS — O3663X0 Maternal care for excessive fetal growth, third trimester, not applicable or unspecified: Secondary | ICD-10-CM | POA: Insufficient documentation

## 2021-10-16 DIAGNOSIS — O0993 Supervision of high risk pregnancy, unspecified, third trimester: Secondary | ICD-10-CM

## 2021-10-16 DIAGNOSIS — Z6841 Body Mass Index (BMI) 40.0 and over, adult: Secondary | ICD-10-CM

## 2021-10-16 LAB — POCT URINALYSIS DIPSTICK OB
Bilirubin, UA: NEGATIVE
Blood, UA: NEGATIVE
Glucose, UA: NEGATIVE
Ketones, UA: NEGATIVE
Nitrite, UA: NEGATIVE
Spec Grav, UA: 1.015 (ref 1.010–1.025)
Urobilinogen, UA: 0.2 E.U./dL
pH, UA: 6 (ref 5.0–8.0)

## 2021-10-16 NOTE — Progress Notes (Signed)
ROB: She is much better today. She had some concerns yesterday about decreased fetal movement and visual disturbances. She reports that symptoms have resolved and  baby is moving around more today. She feels tired today, no other concerns.  ?

## 2021-10-16 NOTE — Patient Instructions (Addendum)
Cesarean Delivery ?Cesarean birth, or cesarean delivery, is the surgical delivery of a baby through an incision in the abdomen and the uterus. This may be referred to as a C-section. This procedure may be scheduled ahead of time, or it may be done in an emergency situation. ?Tell a health care provider about: ?Any allergies you have. ?All medicines you are taking, including vitamins, herbs, eye drops, creams, and over-the-counter medicines. ?Any problems you or family members have had with anesthetic medicines. ?Any bleeding problems you have. ?Any surgeries you have had. ?Any medical conditions you have. ?Whether you or any members of your family have a history of deep vein thrombosis (DVT) or pulmonary embolism (PE). ?What are the risks? ?Generally, this is a safe procedure. However, problems may occur, including: ?Infection. ?Bleeding. ?Allergic reactions to medicines. ?Damage to other structures or organs. ?Blood clots. ?Injury to your baby. ?What happens before the procedure? ?Medicines ?Ask your health care provider about: ?Changing or stopping your regular medicines. This is especially important if you are taking diabetes medicines or blood thinners. ?Taking medicines such as aspirin and ibuprofen. These medicines can thin your blood. Do not take these medicines unless your health care provider tells you to take them. ?Taking over-the-counter medicines, vitamins, herbs, and supplements. ?Tests ?Depending on the reason for your cesarean delivery, you may have a physical exam or additional testing, such as an ultrasound. ?You may have your blood or urine tested. ?General instructions ?Follow instructions from your health care provider about eating or drinking restrictions. ?Do not shave your pubic area if you know that you are going to have a cesarean delivery. Shaving before the procedure may increase your risk of infection. ?Plan to have someone take you home from the hospital. ?Ask your health care provider  what steps will be taken to prevent infection. These may include: ?Washing skin with a germ-killing soap. ?Taking antibiotic medicine. ?Questions for your health care provider ?Ask your health care provider about: ?Your pain management plan. This is especially important if you plan to breastfeed your baby. ?How long you will be in the hospital after the procedure. ?Any concerns you may have about receiving blood products, if you need them during the procedure. ?Cord blood banking, if you plan to collect your baby's umbilical cord blood. ?You may also want to ask your health care provider: ?Whether you will be able to hold or breastfeed your baby while you are still in the operating room. ?Whether your baby can stay with you immediately after the procedure and during your recovery. ?Whether a family member or a person of your choice can go with you into the operating room and stay with you during the procedure, immediately after the procedure, and during your recovery. ?What happens during the procedure? ? ?An IV will be inserted into one of your veins. ?Fluid and medicines, such as antibiotics, will be given before the surgery. ?Fetal monitors will be placed on your abdomen to check your baby's heart rate. ?You may be given a warming gown to wear to keep your temperature stable. ?A catheter may be inserted into your bladder through your urethra. This drains your urine during the procedure. ?You may be given one or more of the following: ?A medicine to numb the area (local anesthetic). ?A medicine to make you fall asleep (general anesthetic). ?A medicine (regional anesthetic) that is injected into your back or through a small thin tube placed in your back (spinal anesthetic or epidural anesthetic). This numbs everything below the  injection site and allows you to stay awake during your procedure. If this makes you feel nauseous, tell your health care provider. Medicines will be available to help reduce any nausea you  may feel. An incision will be made in your abdomen, and then in your uterus. If you are awake during your procedure, you may feel tugging and pulling in your abdomen, but you should not feel pain. If you feel pain, tell your health care provider immediately. Your baby will be removed from your uterus. You may feel more pressure or pushing while this happens. Immediately after birth, your baby will be dried and kept warm. You may be able to hold and breastfeed your baby. The umbilical cord may be clamped and cut during this time. This usually occurs after waiting a period of 1-2 minutes after delivery. Your placenta will be removed from your uterus. Your incisions will be closed with stitches (sutures). Staples, skin glue, or adhesive strips may also be applied to the incision in your abdomen. Bandages (dressings) may be placed over the incision in your abdomen. The procedure may vary among health care providers and hospitals. What happens after the procedure? Your blood pressure, heart rate, breathing rate, and blood oxygen level will be monitored until you leave the hospital or clinic. You may continue to receive fluids and medicines through an IV. You will have some pain. Medicines will be available to help control your pain. To help prevent blood clots: You may be given medicines. You may have to wear compression stockings or devices. You will be encouraged to walk around when you are able. Hospital staff will encourage and support bonding with your baby. Your hospital may have you and your baby stay in the same room (rooming in) during your hospital stay to encourage successful bonding and breastfeeding. You may be encouraged to cough and breathe deeply often. This helps to prevent lung problems. If you have a catheter draining your urine, it will be removed as soon as possible after your procedure. Summary Cesarean birth, or cesarean delivery, is the surgical delivery of a baby through an  incision in the abdomen and the uterus. Follow instructions from your health care provider about eating or drinking restrictions before the procedure. You will have some pain after the procedure. Medicines will be available to help control your pain. Hospital staff will encourage and support bonding with your baby after the procedure. Your hospital may have you and your baby stay in the same room (rooming in) during your hospital stay to encourage successful bonding and breastfeeding. This information is not intended to replace advice given to you by your health care provider. Make sure you discuss any questions you have with your health care provider. Document Revised: 01/14/2021 Document Reviewed: 01/14/2021 Elsevier Patient Education  2023 [employer]  

## 2021-10-16 NOTE — Telephone Encounter (Signed)
Thank you for the info. I will see her today for her appointment.

## 2021-10-16 NOTE — Progress Notes (Signed)
ROB: Patient notes that she is feeling miserable in these last few weeks of pregnancy. Was also noting some decreased fetal movement and overall malaise yesterday, but improved some with increasing hydration. Requests delivery prior to 39 weeks. Discussed that 39 weeks was full term, advised on initiatives such as [employer] of [employer] and ACOG who advise against early term elective deliveries. Discussed that we could schedule exactly at 39 weeks however patient notes that this is her anniversary date. Settled on May 5th for scheduling C-section , as infant is still breech as of last ultrasound on 4/13, normal AFI, growth 98%ile.  RTC in 1 week, for NST then.  ?

## 2021-10-21 ENCOUNTER — Encounter: Payer: Self-pay | Admitting: Obstetrics and Gynecology

## 2021-10-21 ENCOUNTER — Inpatient Hospital Stay
Admission: EM | Admit: 2021-10-21 | Discharge: 2021-10-24 | DRG: 786 | Disposition: A | Discharge status: Home / Self Care | Payer: Managed Care, Other (non HMO) | Attending: Obstetrics and Gynecology | Admitting: Obstetrics and Gynecology

## 2021-10-21 ENCOUNTER — Other Ambulatory Visit: Payer: Self-pay

## 2021-10-21 DIAGNOSIS — O43193 Other malformation of placenta, third trimester: Secondary | ICD-10-CM | POA: Diagnosis not present

## 2021-10-21 DIAGNOSIS — O09513 Supervision of elderly primigravida, third trimester: Secondary | ICD-10-CM | POA: Diagnosis present

## 2021-10-21 DIAGNOSIS — O321XX0 Maternal care for breech presentation, not applicable or unspecified: Secondary | ICD-10-CM | POA: Diagnosis present

## 2021-10-21 DIAGNOSIS — O3663X0 Maternal care for excessive fetal growth, third trimester, not applicable or unspecified: Secondary | ICD-10-CM | POA: Diagnosis present

## 2021-10-21 DIAGNOSIS — F419 Anxiety disorder, unspecified: Secondary | ICD-10-CM

## 2021-10-21 DIAGNOSIS — O4292 Full-term premature rupture of membranes, unspecified as to length of time between rupture and onset of labor: Principal | ICD-10-CM | POA: Diagnosis present

## 2021-10-21 DIAGNOSIS — O9902 Anemia complicating childbirth: Secondary | ICD-10-CM | POA: Diagnosis present

## 2021-10-21 DIAGNOSIS — O4202 Full-term premature rupture of membranes, onset of labor within 24 hours of rupture: Secondary | ICD-10-CM

## 2021-10-21 DIAGNOSIS — O99210 Obesity complicating pregnancy, unspecified trimester: Secondary | ICD-10-CM

## 2021-10-21 DIAGNOSIS — Z3A38 38 weeks gestation of pregnancy: Secondary | ICD-10-CM

## 2021-10-21 DIAGNOSIS — O43199 Other malformation of placenta, unspecified trimester: Secondary | ICD-10-CM | POA: Diagnosis present

## 2021-10-21 DIAGNOSIS — O0990 Supervision of high risk pregnancy, unspecified, unspecified trimester: Principal | ICD-10-CM

## 2021-10-21 DIAGNOSIS — O43123 Velamentous insertion of umbilical cord, third trimester: Secondary | ICD-10-CM | POA: Diagnosis present

## 2021-10-21 DIAGNOSIS — O99214 Obesity complicating childbirth: Secondary | ICD-10-CM | POA: Diagnosis present

## 2021-10-21 DIAGNOSIS — O9942 Diseases of the circulatory system complicating childbirth: Secondary | ICD-10-CM | POA: Diagnosis present

## 2021-10-21 DIAGNOSIS — F32A Depression, unspecified: Secondary | ICD-10-CM | POA: Diagnosis not present

## 2021-10-21 DIAGNOSIS — I471 Supraventricular tachycardia: Secondary | ICD-10-CM | POA: Diagnosis present

## 2021-10-21 DIAGNOSIS — O99344 Other mental disorders complicating childbirth: Secondary | ICD-10-CM | POA: Diagnosis not present

## 2021-10-21 DIAGNOSIS — Z6841 Body Mass Index (BMI) 40.0 and over, adult: Secondary | ICD-10-CM

## 2021-10-21 DIAGNOSIS — O4290 Premature rupture of membranes, unspecified as to length of time between rupture and onset of labor, unspecified weeks of gestation: Secondary | ICD-10-CM | POA: Diagnosis present

## 2021-10-21 LAB — CBC
HCT: 36.9 % (ref 36.0–46.0)
Hemoglobin: 12.2 g/dL (ref 12.0–15.0)
MCH: 28.1 pg (ref 26.0–34.0)
MCHC: 33.1 g/dL (ref 30.0–36.0)
MCV: 85 fL (ref 80.0–100.0)
Platelets: 334 10*3/uL (ref 150–400)
RBC: 4.34 MIL/uL (ref 3.87–5.11)
RDW: 13.3 % (ref 11.5–15.5)
WBC: 14.8 10*3/uL — ABNORMAL HIGH (ref 4.0–10.5)
nRBC: 0 % (ref 0.0–0.2)

## 2021-10-21 LAB — TYPE AND SCREEN
ABO/RH(D): A POS
Antibody Screen: NEGATIVE

## 2021-10-21 LAB — ABO/RH: ABO/RH(D): A POS

## 2021-10-21 MED ORDER — SODIUM CHLORIDE (PF) 0.9 % IJ SOLN
INTRAMUSCULAR | Status: AC
  Filled 2021-10-21: qty 50

## 2021-10-21 MED ORDER — BUPIVACAINE HCL (PF) 0.5 % IJ SOLN
INTRAMUSCULAR | Status: AC
  Filled 2021-10-21: qty 30

## 2021-10-21 MED ORDER — LACTATED RINGERS IV SOLN: INTRAVENOUS | Status: DC

## 2021-10-21 MED ORDER — OXYTOCIN BOLUS FROM INFUSION
333.0000 mL | Freq: Once | INTRAVENOUS | Status: DC
Start: 2021-10-21 — End: 2021-10-22
  Administered 2021-10-21: 333 mL via INTRAVENOUS

## 2021-10-21 MED ORDER — ONDANSETRON HCL 4 MG/2ML IJ SOLN: 4.0000 mg | Freq: Four times a day (QID) | INTRAMUSCULAR | Status: DC | PRN

## 2021-10-21 MED ORDER — BUPIVACAINE-MELOXICAM ER 400-12 MG/14ML IJ SOLN
400.0000 mg | Freq: Once | INTRAMUSCULAR | Status: DC
  Filled 2021-10-21: qty 1

## 2021-10-21 MED ORDER — ACETAMINOPHEN 500 MG PO TABS: 1000.0000 mg | ORAL_TABLET | ORAL | Status: DC

## 2021-10-21 MED ORDER — SOD CITRATE-CITRIC ACID 500-334 MG/5ML PO SOLN: 30.0000 mL | ORAL | Status: DC | PRN

## 2021-10-21 MED ORDER — CEFAZOLIN IN SODIUM CHLORIDE 3-0.9 GM/100ML-% IV SOLN
3.0000 g | INTRAVENOUS | Status: DC
  Filled 2021-10-21 (×2): qty 100

## 2021-10-21 MED ORDER — LACTATED RINGERS IV SOLN: 500.0000 mL | INTRAVENOUS | Status: DC | PRN

## 2021-10-21 MED ORDER — LIDOCAINE HCL (PF) 1 % IJ SOLN: 30.0000 mL | INTRAMUSCULAR | Status: DC | PRN

## 2021-10-21 MED ORDER — OXYTOCIN-SODIUM CHLORIDE 30-0.9 UT/500ML-% IV SOLN
2.5000 [IU]/h | INTRAVENOUS | Status: DC
  Administered 2021-10-22: 2.5 [IU]/h via INTRAVENOUS
  Filled 2021-10-21: qty 500

## 2021-10-21 MED ORDER — BUPIVACAINE HCL (PF) 0.5 % IJ SOLN
INTRAMUSCULAR | Status: AC
  Filled 2021-10-21: qty 10

## 2021-10-21 MED ORDER — SOD CITRATE-CITRIC ACID 500-334 MG/5ML PO SOLN
ORAL | Status: AC
  Administered 2021-10-22: 30 mL via ORAL
  Filled 2021-10-21: qty 15

## 2021-10-21 MED ORDER — METOCLOPRAMIDE HCL 5 MG/ML IJ SOLN
10.0000 mg | Freq: Once | INTRAMUSCULAR | Status: AC
  Administered 2021-10-21: 10 mg via INTRAVENOUS
  Filled 2021-10-21: qty 2

## 2021-10-21 MED ORDER — GABAPENTIN 300 MG PO CAPS
300.0000 mg | ORAL_CAPSULE | ORAL | Status: AC
  Administered 2021-10-21: 300 mg via ORAL
  Filled 2021-10-21 (×2): qty 1

## 2021-10-21 MED ORDER — BUPIVACAINE LIPOSOME 1.3 % IJ SUSP
INTRAMUSCULAR | Status: DC
Start: 2021-10-21 — End: 2021-10-21
  Filled 2021-10-21: qty 20

## 2021-10-21 MED ORDER — POVIDONE-IODINE 10 % EX SWAB
2.0000 "application " | Freq: Once | CUTANEOUS | Status: AC
  Administered 2021-10-21: 2 via TOPICAL

## 2021-10-21 MED ORDER — FAMOTIDINE IN NACL 20-0.9 MG/50ML-% IV SOLN
20.0000 mg | Freq: Once | INTRAVENOUS | Status: AC
Start: 2021-10-21 — End: 2021-10-22
  Administered 2021-10-21: 20 mg via INTRAVENOUS
  Filled 2021-10-21: qty 50

## 2021-10-21 NOTE — Anesthesia Preprocedure Evaluation (Signed)
Anesthesia Evaluation  ?Patient identified by MRN, date of birth, ID band ?Patient awake ? ? ? ?Reviewed: ?Allergy & Precautions, NPO status , Patient's Chart, lab work & pertinent test results ? ?Airway ?Mallampati: IV ? ?TM Distance: >3 FB ?Neck ROM: Full ? ? ? Dental ?no notable dental hx. ? ?  ?Pulmonary ?shortness of breath and with exertion,  ?  ?Pulmonary exam normal ? ? ? ? ? ? ? Cardiovascular ?Normal cardiovascular exam+ dysrhythmias (PSVT )  ? ? ?  ?Neuro/Psych ?PSYCHIATRIC DISORDERS DDD ?  ? GI/Hepatic ?negative GI ROS, Neg liver ROS,   ?Endo/Other  ?Morbid obesity ? Renal/GU ?negative Renal ROS  ?negative genitourinary ?  ?Musculoskeletal ? ?(+) Arthritis ,  ? Abdominal ?(+) + obese,   ?Peds ?negative pediatric ROS ?(+)  Hematology ?negative hematology ROS ?(+)   ?Anesthesia Other Findings ?Breech presentation, no version with PROM ? Reproductive/Obstetrics ?(+) Pregnancy ? ?  ? ? ? ? ? ? ? ? ? ? ? ? ? ?  ?  ? ? ? ? ? ? ? ? ?Anesthesia Physical ?Anesthesia Plan ? ?ASA: 3 ? ?Anesthesia Plan: Spinal  ? ?Post-op Pain Management:   ? ?Induction:  ? ?PONV Risk Score and Plan:  ? ?Airway Management Planned: Natural Airway and Nasal Cannula ? ?Additional Equipment:  ? ?Intra-op Plan:  ? ?Post-operative Plan:  ? ?Informed Consent: I have reviewed the patients History and Physical, chart, labs and discussed the procedure including the risks, benefits and alternatives for the proposed anesthesia with the patient or authorized representative who has indicated his/her understanding and acceptance.  ? ? ? ?Dental Advisory Given ? ?Plan Discussed with: Anesthesiologist, CRNA and Surgeon ? ?Anesthesia Plan Comments: (Patient reports no bleeding problems and no anticoagulant use. ? ?Plan for spinal with backup GA ? ?Last ate 5 pm. Will provide aspiration prophylaxis with metoclopramide and famotidine and bicitra and to wait until 1am unless urgent.  ?Ultrasound was used to mark L3/4  interspace and midline and note a depth of 6cm to epidural space when the subcutaneous tissue was compressed with probe. )  ? ? ? ? ? ? ?Anesthesia Quick Evaluation ? ?

## 2021-10-21 NOTE — H&P (Addendum)
Obstetric History and Physical  Kristina Mccall is a 36 y.o. G1P0000 with IUP at [redacted]w[redacted]d presenting for complaints of ruptured membranes since 6:00 pm this evening. Notes that she was loading her dishwasher and felt the urge to urinate. After voiding, several minutes later, she noted another gush of fluid.  Since then she has continued to leak.  Fluid was noted to be clear. Patient states she has been having  occasional contractions,  denies vaginal bleeding, and notes active fetal movement.  Of note, patient is noted to have breech presentation of fetus.   Prenatal Course Source of Care: [facility] with onset of care at 32 weeks (transfer of care from [facility], with onset of care at [redacted] weeks gestation).   Pregnancy complications or risks: Patient Active Problem List   Diagnosis Date Noted   PROM (premature rupture of membranes) 10/21/2021   Breech presentation, no version 10/16/2021   Excessive fetal growth affecting management of mother in third trimester, antepartum 10/16/2021   Health education/counseling 10/07/2021   Marginal insertion of umbilical cord affecting management of mother 08/20/2021   PSVT (paroxysmal supraventricular tachycardia) (HCC) 08/09/2021   Advanced maternal age, 1st pregnancy, third trimester 08/09/2021   Obesity in pregnancy 06/12/2021   BMI 50.0-59.9, adult (HCC) 04/27/2021   Obesity affecting pregnancy in first trimester 03/13/2021   Supervision of high risk pregnancy, antepartum 03/13/2021   History of cervical dysplasia 08/18/2020   Decreased libido 05/29/2018   Anxiety and depression 05/29/2018   She plans to breastfeed She desires IUD for postpartum contraception.   Prenatal labs and studies: ABO, Rh: --/--/A POS Performed at [facility], 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; 02/11/2010;11/27/2012; 12/15/2012   positive high risk   LGSIL on Pap smear of cervix 03/18/2009   lgsil/hpv present    Past Surgical History:  Procedure Laterality Date   COLPOSCOPY  2009/2010   no bx done   INTRAUTERINE DEVICE (IUD) INSERTION  10/24/2013    OB History  Gravida Para Term Preterm AB Living  1 0 0 0 0 0  SAB IAB Ectopic Multiple Live Births  0 0 0 0 0    # Outcome Date GA Lbr Len/2nd Weight Sex Delivery Anes PTL Lv  1 Current             Social History   Socioeconomic History   Marital status: Married    Spouse name: Not on file   Number of children: Not on file   Years of education: Not on file   Highest education level: Not on file  Occupational History   Occupation: Urgent Care  Tobacco Use   Smoking status: Never   Smokeless tobacco: Never  Vaping Use   Vaping Use: Never used  Substance and Sexual Activity   Alcohol use: Not Currently   Drug use: No   Sexual activity: Yes  Birth control/protection: I.U.D.  Other Topics Concern   Not on file  Social History Narrative   Not on file   Social Determinants of Health   Financial Resource Strain: Not on file  Food Insecurity: No Food Insecurity   Worried About Running Out of Food in the Last Year: Never true   Ran Out of Food in the Last Year: Never true  Transportation Needs: No Transportation Needs   Lack of Transportation (Medical): No   Lack of Transportation (Non-Medical): No  Physical Activity: Not on file  Stress: Not on file  Social Connections:  Not on file    Family History  Problem Relation Age of Onset   Cancer Mother        cervical   Hyperlipidemia Mother    Hypertension Mother    Hyperlipidemia Father    Diabetes Maternal Grandmother    Heart disease Maternal Grandfather     Medications Prior to Admission  Medication Sig Dispense Refill Last Dose   aspirin EC 81 MG tablet Take 81 mg by mouth daily. Swallow whole.   Past Week   Prenatal Vit-Fe Fumarate-FA (PRENATAL MULTIVITAMIN) TABS tablet Take 1 tablet by mouth daily at 12 noon.   Past Week    Allergies  Allergen Reactions   Amoxicillin Nausea And Vomiting    Other reaction(s): GI intolerance   Ciprofloxacin Swelling    Pitting edema    Doxycycline Nausea And Vomiting   Hydrocodone Hives   Methylprednisolone Sodium Succ Hives   Other Hives    methylprednisone   Penicillins Nausea And Vomiting   Latex Rash    Review of Systems: Negative except for what is mentioned in HPI.  Physical Exam: BP 130/76 (BP Location: Left Arm)   Pulse 91   Temp 98.9 F (37.2 C) (Oral)   Resp 18   Ht 5\' 3"  (1.6 m)   Wt (!) 149.7 kg   LMP 01/13/2021   BMI 58.47 kg/m  CONSTITUTIONAL: Well-developed, well-nourished female in no acute distress.  HENT:  Normocephalic, atraumatic, External right and left ear normal. Oropharynx is clear and moist EYES: Conjunctivae and EOM are normal. Pupils are equal, round, and reactive to light. No scleral icterus.  NECK: Normal range of motion, supple, no masses SKIN: Skin is warm and dry. No rash noted. Not diaphoretic. No erythema. No pallor. NEUROLOGIC: Alert and oriented to person, place, and time. Normal reflexes, muscle tone coordination. No cranial nerve deficit noted. PSYCHIATRIC: Normal mood and affect. Normal behavior. Normal judgment and thought content. CARDIOVASCULAR: Normal heart rate noted, regular rhythm RESPIRATORY: Effort and breath sounds normal, no problems with respiration noted ABDOMEN: Soft, nontender,  nondistended, gravid. MUSCULOSKELETAL: Normal range of motion. No edema and no tenderness. 2+ distal pulses.  Cervical Exam: Deferred at patient request, however perineum wet, grossly ruptured.  Presentation: cephalic FHT:  Baseline rate 150 bpm   Variability moderate  Accelerations present   Decelerations variable x 1 within 10 minutes of initial presentation.  Contractions: Irregular, every 3-6 mins   Pertinent Labs/Studies:   Results for orders placed or performed during the hospital encounter of 10/21/21 (from the past 24 hour(s))  CBC     Status: Abnormal   Collection Time: 10/21/21  8:03 PM  Result Value Ref Range   WBC 14.8 (H) 4.0 - 10.5 K/uL   RBC 4.34 3.87 - 5.11 MIL/uL   Hemoglobin 12.2 12.0 - 15.0 g/dL   HCT 40.9 81.1 - 91.4 %   MCV 85.0 80.0 - 100.0  fL   MCH 28.1 26.0 - 34.0 pg   MCHC 33.1 30.0 - 36.0 g/dL   RDW 57.8 46.9 - 62.9 %   Platelets 334 150 - 400 K/uL   nRBC 0.0 0.0 - 0.2 %  Type and screen [facility]     Status: None   Collection Time: 10/21/21  8:03 PM  Result Value Ref Range   ABO/RH(D) A POS    Antibody Screen NEG    Sample Expiration      10/24/2021,2359 Performed at [facility], [address], [place], [place] [address]   ABO/Rh     Status: None   Collection Time: 10/21/21  8:33 PM  Result Value Ref Range   ABO/RH(D)      A POS Performed at [facility], [address], [place], [place] [address]     Imaging:  [place] MFM FETAL BPP WO NON STRESS ----------------------------------------------------------------------  OBSTETRICS REPORT                       (Signed Final 10/08/2021 02:48 pm) ---------------------------------------------------------------------- Patient Info  ID #:       [id number]                          D.O.B.:  1985/07/17 (36 yrs)  Name:       Kristina Mccall                  Visit Date: 10/08/2021 02:08 pm ---------------------------------------------------------------------- Performed  By  Attending:        Ma Rings MD         Ref. Address:     [address] [place],                                                             [place] [address]  Performed By:     Birdena Crandall        [facility]                    RDMS,RVT                                 Fetal Care at                                                             [facility]  Referred By:      Nadara Mustard MD ---------------------------------------------------------------------- Orders  #  Description                           Code        Ordered By  1  [place] MFM OB FOLLOW UP                   [id number]    RAVI SHANKAR  2  [place] MFM FETAL BPP WO NON               76819.01    [facility]     STRESS ----------------------------------------------------------------------  #  Order #                     Accession #                Episode #  1  [id number]                   [id number]                 [id number]  2  [id number]                   [id number]                 [id number] ---------------------------------------------------------------------- Indications  Advanced maternal age primigravida 30+,        O23.519  unspecified trimester  Obesity complicating pregnancy, third          O99.213  trimester (BMI:50-59)  Marginal insertion of umbilical cord affecting O43.193  management of mother in third trimester  Encounter for antenatal screening for          Z36.3  malformations  Low risk NIPT, XX, FF:5%  Anxiety during pregnancy, third trimester      O99.343, F41.9  [redacted] weeks gestation of pregnancy                Z3A.36 ---------------------------------------------------------------------- Fetal Evaluation  Num Of Fetuses:         1  Fetal Heart Rate(bpm):  133  Cardiac Activity:       Observed  Presentation:           Breech  Placenta:               Posterior Fundal  P. Cord Insertion:       Previously Visualized  Amniotic Fluid  AFI FV:      Within normal limits  AFI Sum(cm)     %Tile       Largest Pocket(cm)  14.3            52          5.6  RUQ(cm)       RLQ(cm)       LUQ(cm)        LLQ(cm)  5.6           2.7           3.7            2.3 ---------------------------------------------------------------------- Biophysical Evaluation  Amniotic F.V:   Within normal limits       F. Tone:        Observed  F. Movement:    Observed                   Score:          8/8  F. Breathing:   Observed ---------------------------------------------------------------------- Biometry  BPD:      96.4  mm     G. Age:  39w 3d       > 99  %    CI:        79.22   %    70 - 86                                                          FL/HC:      21.1   %    20.1 - 22.1  HC:      342.4  mm     G. Age:  39w 3d         90  %    HC/AC:      0.97        0.93 - 1.11  AC:      352.7  mm     G. Age:  39w 1d       > 99  %    FL/BPD:     75.0   %    71 - 87  FL:       72.3  mm     G. Age:  37w 0d         66  %    FL/AC:      20.5   %    20 - 24  Est. FW:    3592  gm    7 lb 15 oz      98  % ---------------------------------------------------------------------- OB History  Gravidity:    1 ---------------------------------------------------------------------- Gestational Age  LMP:           36w 2d        Date:  01/27/21                 EDD:   11/03/21  U/S Today:     38w 5d                                        EDD:   10/17/21  Best:          36w 2d     Det. By:  LMP  (01/27/21)          EDD:   11/03/21 ---------------------------------------------------------------------- Anatomy  Cranium:               Appears normal         Bladder:                Appears normal ---------------------------------------------------------------------- Comments  This patient was seen for a follow up growth scan due to  maternal obesity.  She denies any problems since her last  exam and reports that she has screened  negative for  gestational diabetes  She was informed that the fetal growth continues to  measures large for her gestational age (EFW 7 pounds 15  ounces, 98th percentile for her gestational age).  There was  normal amniotic fluid noted.  A BPP performed today was 8 out of 8.  The fetus remains in the breech presentation.  The patient reports that she will most likely have a cesarean  delivery scheduled at around 39 weeks.  She should  continue weekly fetal testing in your office until  delivery. ----------------------------------------------------------------------                   Ma Rings, MD Electronically Signed Final Report   10/08/2021 02:48 pm ---------------------------------------------------------------------- [place] MFM OB FOLLOW UP ----------------------------------------------------------------------  OBSTETRICS REPORT                       (Signed Final 10/08/2021 02:48 pm) ---------------------------------------------------------------------- Patient Info  ID #:       [id number]                          D.O.B.:  14-Aug-1985 (36 yrs)  Name:       Kristina Mccall                  Visit Date: 10/08/2021 02:08 pm ---------------------------------------------------------------------- Performed By  Attending:        Ma Rings MD         Ref. Address:     [address] [place],                                                             [place] [address]  Performed By:     Birdena Crandall        [facility]                    RDMS,RVT                                 Fetal Care at                                                             [facility]  Referred By:      Nadara Mustard MD ---------------------------------------------------------------------- Orders  #  Description                           Code        Ordered By  1  [place] MFM OB FOLLOW UP                    01027.25    RAVI SHANKAR  2  [place] MFM FETAL BPP WO NON               36644.03    RAVI SHANKAR     STRESS ----------------------------------------------------------------------  #  Order #  Accession #                Episode #  1  [id number]                   [id number]                 [id number]  2  [id number]                   [id number]                 [id number] ---------------------------------------------------------------------- Indications  Advanced maternal age primigravida 78+,        O21.519  unspecified trimester  Obesity complicating pregnancy, third          O99.213  trimester (BMI:50-59)  Marginal insertion of umbilical cord affecting O43.193  management of mother in third trimester  Encounter for antenatal screening for          Z36.3  malformations  Low risk NIPT, XX, FF:5%  Anxiety during pregnancy, third trimester      O99.343, F41.9  [redacted] weeks gestation of pregnancy                Z3A.36 ---------------------------------------------------------------------- Fetal Evaluation  Num Of Fetuses:         1  Fetal Heart Rate(bpm):  133  Cardiac Activity:       Observed  Presentation:           Breech  Placenta:               Posterior Fundal  P. Cord Insertion:      Previously Visualized  Amniotic Fluid  AFI FV:      Within normal limits  AFI Sum(cm)     %Tile       Largest Pocket(cm)  14.3            52          5.6  RUQ(cm)       RLQ(cm)       LUQ(cm)        LLQ(cm)  5.6           2.7           3.7            2.3 ---------------------------------------------------------------------- Biophysical Evaluation  Amniotic F.V:   Within normal limits       F. Tone:        Observed  F. Movement:    Observed                   Score:          8/8  F. Breathing:   Observed ---------------------------------------------------------------------- Biometry  BPD:      96.4  mm     G. Age:  39w 3d       > 99  %    CI:        79.22   %    70 - 86                                                           FL/HC:      21.1   %    20.1 - 22.1  HC:  342.4  mm     G. Age:  39w 3d         90  %    HC/AC:      0.97        0.93 - 1.11  AC:      352.7  mm     G. Age:  39w 1d       > 99  %    FL/BPD:     75.0   %    71 - 87  FL:       72.3  mm     G. Age:  37w 0d         66  %    FL/AC:      20.5   %    20 - 24  Est. FW:    3592  gm    7 lb 15 oz      98  % ---------------------------------------------------------------------- OB History  Gravidity:    1 ---------------------------------------------------------------------- Gestational Age  LMP:           36w 2d        Date:  01/27/21                 EDD:   11/03/21  U/S Today:     38w 5d                                        EDD:   10/17/21  Best:          36w 2d     Det. By:  LMP  (01/27/21)          EDD:   11/03/21 ---------------------------------------------------------------------- Anatomy  Cranium:               Appears normal         Bladder:                Appears normal ---------------------------------------------------------------------- Comments  This patient was seen for a follow up growth scan due to  maternal obesity.  She denies any problems since her last  exam and reports that she has screened negative for  gestational diabetes  She was informed that the fetal growth continues to  measures large for her gestational age (EFW 7 pounds 15  ounces, 98th percentile for her gestational age).  There was  normal amniotic fluid noted.  A BPP performed today was 8 out of 8.  The fetus remains in the breech presentation.  The patient reports that she will most likely have a cesarean  delivery scheduled at around 39 weeks.  She should continue weekly fetal testing in your office until  delivery. ----------------------------------------------------------------------                   Ma Rings, MD Electronically Signed Final Report   10/08/2021 02:48  pm ----------------------------------------------------------------------    Assessment : Kristina Mccall is a 36 y.o. G1P0000 at [redacted]w[redacted]d being admitted for PROM with breech presentation.  Other issues this pregnancy include advanced maternal age, morbid obesity, marginal cord insertion, h/o anxiety and depression.   Plan: The risks of surgery were discussed with the patient including but were not limited to: bleeding which may require transfusion or reoperation; infection which may require antibiotics; injury to bowel, bladder, ureters or other surrounding organs; injury to the fetus; need for additional procedures  including hysterectomy in the event of a life-threatening hemorrhage; formation of adhesions; placental abnormalities with subsequent pregnancies; incisional problems; thromboembolic phenomenon and other postoperative/anesthesia complications.  The patient concurred with the proposed plan, giving informed written consent for the procedure.   Patient last ate at 5 pm. She will remain NPO for procedure. Anesthesia and OR aware. Preoperative prophylactic antibiotics and SCDs ordered on call to the OR.  To OR when ready.    Hildred Laser, MD [facility]

## 2021-10-21 NOTE — OB Triage Note (Signed)
Pt is a 36y/o G1P0 at [redacted]w[redacted]d with c/o SROM at 6pm today. Pt states +FM. Pt states gush happened after using the bathroom and continued to leak. Pt states when wiping bloody mucous was present. Pt states positive fetal movement. Monitors applied and assessing. Initial FHT 150.  ?

## 2021-10-22 ENCOUNTER — Encounter: Payer: Self-pay | Admitting: Obstetrics and Gynecology

## 2021-10-22 ENCOUNTER — Inpatient Hospital Stay: Payer: Managed Care, Other (non HMO) | Admitting: Anesthesiology

## 2021-10-22 ENCOUNTER — Encounter
Admission: EM | Disposition: A | Discharge status: Home / Self Care | Payer: Self-pay | Source: Home / Self Care | Attending: Obstetrics and Gynecology

## 2021-10-22 DIAGNOSIS — O99214 Obesity complicating childbirth: Secondary | ICD-10-CM

## 2021-10-22 DIAGNOSIS — F419 Anxiety disorder, unspecified: Secondary | ICD-10-CM

## 2021-10-22 DIAGNOSIS — O99344 Other mental disorders complicating childbirth: Secondary | ICD-10-CM

## 2021-10-22 DIAGNOSIS — Z3A38 38 weeks gestation of pregnancy: Secondary | ICD-10-CM

## 2021-10-22 DIAGNOSIS — O321XX0 Maternal care for breech presentation, not applicable or unspecified: Secondary | ICD-10-CM

## 2021-10-22 DIAGNOSIS — O4202 Full-term premature rupture of membranes, onset of labor within 24 hours of rupture: Secondary | ICD-10-CM

## 2021-10-22 DIAGNOSIS — O3663X0 Maternal care for excessive fetal growth, third trimester, not applicable or unspecified: Secondary | ICD-10-CM

## 2021-10-22 DIAGNOSIS — O09513 Supervision of elderly primigravida, third trimester: Secondary | ICD-10-CM

## 2021-10-22 DIAGNOSIS — O43193 Other malformation of placenta, third trimester: Secondary | ICD-10-CM

## 2021-10-22 DIAGNOSIS — F32A Depression, unspecified: Secondary | ICD-10-CM

## 2021-10-22 LAB — CBC
HCT: 33.2 % — ABNORMAL LOW (ref 36.0–46.0)
Hemoglobin: 11.1 g/dL — ABNORMAL LOW (ref 12.0–15.0)
MCH: 29.2 pg (ref 26.0–34.0)
MCHC: 33.4 g/dL (ref 30.0–36.0)
MCV: 87.4 fL (ref 80.0–100.0)
Platelets: 277 10*3/uL (ref 150–400)
RBC: 3.8 MIL/uL — ABNORMAL LOW (ref 3.87–5.11)
RDW: 13.6 % (ref 11.5–15.5)
WBC: 18 10*3/uL — ABNORMAL HIGH (ref 4.0–10.5)
nRBC: 0 % (ref 0.0–0.2)

## 2021-10-22 LAB — RPR: RPR Ser Ql: NONREACTIVE

## 2021-10-22 SURGERY — Surgical Case
Anesthesia: Spinal

## 2021-10-22 MED ORDER — OXYCODONE HCL 5 MG PO TABS: 5.0000 mg | ORAL_TABLET | Freq: Four times a day (QID) | ORAL | Status: DC | PRN

## 2021-10-22 MED ORDER — HYDROMORPHONE HCL 1 MG/ML IJ SOLN: 1.0000 mg | INTRAMUSCULAR | Status: DC | PRN

## 2021-10-22 MED ORDER — NALOXONE HCL 0.4 MG/ML IJ SOLN: 0.4000 mg | INTRAMUSCULAR | Status: DC | PRN

## 2021-10-22 MED ORDER — MORPHINE SULFATE (PF) 0.5 MG/ML IJ SOLN
INTRAMUSCULAR | Status: DC | PRN
  Administered 2021-10-22: .1 mg via INTRATHECAL

## 2021-10-22 MED ORDER — ACETAMINOPHEN 500 MG PO TABS
1000.0000 mg | ORAL_TABLET | Freq: Four times a day (QID) | ORAL | Status: AC
  Administered 2021-10-22 – 2021-10-23 (×4): 1000 mg via ORAL
  Filled 2021-10-22 (×4): qty 2

## 2021-10-22 MED ORDER — SCOPOLAMINE 1 MG/3DAYS TD PT72
1.0000 | MEDICATED_PATCH | Freq: Once | TRANSDERMAL | Status: DC
  Administered 2021-10-22: 1.5 mg via TRANSDERMAL
  Filled 2021-10-22: qty 1

## 2021-10-22 MED ORDER — DIPHENHYDRAMINE HCL 25 MG PO CAPS: 25.0000 mg | ORAL_CAPSULE | ORAL | Status: DC | PRN

## 2021-10-22 MED ORDER — OXYCODONE HCL 5 MG PO TABS
5.0000 mg | ORAL_TABLET | ORAL | Status: DC | PRN
  Administered 2021-10-24: 5 mg via ORAL
  Filled 2021-10-22: qty 1

## 2021-10-22 MED ORDER — FENTANYL CITRATE (PF) 100 MCG/2ML IJ SOLN
INTRAMUSCULAR | Status: AC
Start: 2021-10-22 — End: ?
  Filled 2021-10-22: qty 2

## 2021-10-22 MED ORDER — OXYTOCIN-SODIUM CHLORIDE 30-0.9 UT/500ML-% IV SOLN: 2.5000 [IU]/h | INTRAVENOUS | Status: AC

## 2021-10-22 MED ORDER — FERROUS SULFATE 325 (65 FE) MG PO TABS
325.0000 mg | ORAL_TABLET | ORAL | Status: DC
  Administered 2021-10-23: 325 mg via ORAL
  Filled 2021-10-22: qty 1

## 2021-10-22 MED ORDER — SODIUM CHLORIDE 0.9% FLUSH: 3.0000 mL | INTRAVENOUS | Status: DC | PRN

## 2021-10-22 MED ORDER — NALOXONE HCL 4 MG/10ML IJ SOLN
1.0000 ug/kg/h | INTRAVENOUS | Status: DC | PRN
  Filled 2021-10-22: qty 5

## 2021-10-22 MED ORDER — DIPHENHYDRAMINE HCL 50 MG/ML IJ SOLN: 12.5000 mg | INTRAMUSCULAR | Status: DC | PRN

## 2021-10-22 MED ORDER — KETOROLAC TROMETHAMINE 30 MG/ML IJ SOLN: 30.0000 mg | Freq: Four times a day (QID) | INTRAMUSCULAR | Status: AC | PRN

## 2021-10-22 MED ORDER — PHENYLEPHRINE HCL-NACL 20-0.9 MG/250ML-% IV SOLN
INTRAVENOUS | Status: DC | PRN
Start: 2021-10-22 — End: 2021-10-22
  Administered 2021-10-22: 40 ug/min via INTRAVENOUS

## 2021-10-22 MED ORDER — BUPIVACAINE IN DEXTROSE 0.75-8.25 % IT SOLN
INTRATHECAL | Status: DC | PRN
  Administered 2021-10-22: 1.3 mL via INTRATHECAL

## 2021-10-22 MED ORDER — KETOROLAC TROMETHAMINE 30 MG/ML IJ SOLN
30.0000 mg | Freq: Four times a day (QID) | INTRAMUSCULAR | Status: AC
  Administered 2021-10-22 – 2021-10-23 (×4): 30 mg via INTRAVENOUS
  Filled 2021-10-22 (×4): qty 1

## 2021-10-22 MED ORDER — SENSORCAINE 0.25 % 300ML FOR PAIN PUMP OPTIME
INTRAMUSCULAR | Status: DC | PRN
  Administered 2021-10-22: 300 mL

## 2021-10-22 MED ORDER — GABAPENTIN 300 MG PO CAPS
300.0000 mg | ORAL_CAPSULE | Freq: Two times a day (BID) | ORAL | Status: DC
  Administered 2021-10-22 – 2021-10-24 (×5): 300 mg via ORAL
  Filled 2021-10-22 (×5): qty 1

## 2021-10-22 MED ORDER — IBUPROFEN 600 MG PO TABS
600.0000 mg | ORAL_TABLET | Freq: Four times a day (QID) | ORAL | Status: DC
  Administered 2021-10-23 – 2021-10-24 (×5): 600 mg via ORAL
  Filled 2021-10-22 (×5): qty 1

## 2021-10-22 MED ORDER — WITCH HAZEL-GLYCERIN EX PADS: 1.0000 "application " | MEDICATED_PAD | CUTANEOUS | Status: DC | PRN

## 2021-10-22 MED ORDER — LACTATED RINGERS IV SOLN: INTRAVENOUS | Status: DC

## 2021-10-22 MED ORDER — CEFAZOLIN SODIUM 10 G IJ SOLR
INTRAMUSCULAR | Status: DC | PRN
  Administered 2021-10-22: 3 g via INTRAVENOUS

## 2021-10-22 MED ORDER — DIBUCAINE (PERIANAL) 1 % EX OINT: 1.0000 "application " | TOPICAL_OINTMENT | CUTANEOUS | Status: DC | PRN

## 2021-10-22 MED ORDER — TERBUTALINE SULFATE 1 MG/ML IJ SOLN: 0.2500 mg | Freq: Once | INTRAMUSCULAR | Status: AC

## 2021-10-22 MED ORDER — SENNOSIDES-DOCUSATE SODIUM 8.6-50 MG PO TABS
2.0000 | ORAL_TABLET | Freq: Every day | ORAL | Status: DC
  Administered 2021-10-23 – 2021-10-24 (×2): 2 via ORAL
  Filled 2021-10-22 (×2): qty 2

## 2021-10-22 MED ORDER — ONDANSETRON HCL 4 MG/2ML IJ SOLN: 4.0000 mg | Freq: Three times a day (TID) | INTRAMUSCULAR | Status: DC | PRN

## 2021-10-22 MED ORDER — ACETAMINOPHEN 500 MG PO TABS: 1000.0000 mg | ORAL_TABLET | Freq: Four times a day (QID) | ORAL | Status: DC

## 2021-10-22 MED ORDER — MAGNESIUM HYDROXIDE 400 MG/5ML PO SUSP: 30.0000 mL | ORAL | Status: DC | PRN

## 2021-10-22 MED ORDER — ENOXAPARIN SODIUM 40 MG/0.4ML IJ SOSY
40.0000 mg | PREFILLED_SYRINGE | INTRAMUSCULAR | Status: DC
  Administered 2021-10-23 – 2021-10-24 (×2): 40 mg via SUBCUTANEOUS
  Filled 2021-10-22 (×2): qty 0.4

## 2021-10-22 MED ORDER — COCONUT OIL OIL: 1.0000 "application " | TOPICAL_OIL | Status: DC | PRN

## 2021-10-22 MED ORDER — MENTHOL 3 MG MT LOZG: 1.0000 | LOZENGE | OROMUCOSAL | Status: DC | PRN

## 2021-10-22 MED ORDER — SIMETHICONE 80 MG PO CHEW: 80.0000 mg | CHEWABLE_TABLET | ORAL | Status: DC | PRN

## 2021-10-22 MED ORDER — MIDAZOLAM HCL 2 MG/2ML IJ SOLN
INTRAMUSCULAR | Status: AC
  Filled 2021-10-22: qty 2

## 2021-10-22 MED ORDER — TERBUTALINE SULFATE 1 MG/ML IJ SOLN
INTRAMUSCULAR | Status: AC
  Administered 2021-10-22: 0.25 mg via SUBCUTANEOUS
  Filled 2021-10-22: qty 1

## 2021-10-22 MED ORDER — PRENATAL MULTIVITAMIN CH
1.0000 | ORAL_TABLET | Freq: Every day | ORAL | Status: DC
  Administered 2021-10-22 – 2021-10-23 (×2): 1 via ORAL
  Filled 2021-10-22 (×2): qty 1

## 2021-10-22 MED ORDER — LIDOCAINE HCL (PF) 2 % IJ SOLN
INTRAMUSCULAR | Status: DC | PRN
  Administered 2021-10-22: 100 mg via EPIDURAL

## 2021-10-22 MED ORDER — DIPHENHYDRAMINE HCL 25 MG PO CAPS: 25.0000 mg | ORAL_CAPSULE | Freq: Four times a day (QID) | ORAL | Status: DC | PRN

## 2021-10-22 MED ORDER — ZOLPIDEM TARTRATE 5 MG PO TABS: 5.0000 mg | ORAL_TABLET | Freq: Every evening | ORAL | Status: DC | PRN

## 2021-10-22 MED ORDER — MORPHINE SULFATE (PF) 0.5 MG/ML IJ SOLN
INTRAMUSCULAR | Status: AC
  Filled 2021-10-22: qty 10

## 2021-10-22 MED ORDER — BUPIVACAINE HCL 0.5 % IJ SOLN
INTRAMUSCULAR | Status: DC | PRN
  Administered 2021-10-22: 30 mL

## 2021-10-22 MED ORDER — FENTANYL CITRATE (PF) 100 MCG/2ML IJ SOLN
INTRAMUSCULAR | Status: DC | PRN
  Administered 2021-10-22: 15 ug via INTRATHECAL

## 2021-10-22 SURGICAL SUPPLY — 32 items
BACTOSHIELD CHG 4% 4OZ (MISCELLANEOUS) ×1
BAG COUNTER SPONGE SURGICOUNT (BAG) ×2 IMPLANT
CHLORAPREP W/TINT 26 (MISCELLANEOUS) ×4 IMPLANT
DRSG TELFA 3X8 NADH (GAUZE/BANDAGES/DRESSINGS) ×2 IMPLANT
ELECT REM PT RETURN 9FT ADLT (ELECTROSURGICAL) ×2
ELECTRODE REM PT RTRN 9FT ADLT (ELECTROSURGICAL) ×1 IMPLANT
EXTRT SYSTEM ALEXIS 17CM (MISCELLANEOUS)
GAUZE SPONGE 4X4 12PLY STRL (GAUZE/BANDAGES/DRESSINGS) ×2 IMPLANT
GLOVE BIO SURGEON STRL SZ 6.5 (GLOVE) ×2 IMPLANT
GLOVE SURG UNDER LTX SZ7 (GLOVE) ×2 IMPLANT
GOWN STRL REUS W/ TWL LRG LVL3 (GOWN DISPOSABLE) ×2 IMPLANT
GOWN STRL REUS W/TWL LRG LVL3 (GOWN DISPOSABLE) ×2
KIT TURNOVER KIT A (KITS) ×2 IMPLANT
MANIFOLD NEPTUNE II (INSTRUMENTS) ×2 IMPLANT
MAT PREVALON FULL STRYKER (MISCELLANEOUS) ×2 IMPLANT
NS IRRIG 1000ML POUR BTL (IV SOLUTION) ×2 IMPLANT
PACK C SECTION AR (MISCELLANEOUS) ×2 IMPLANT
PAD DRESSING TELFA 3X8 NADH (GAUZE/BANDAGES/DRESSINGS) ×1 IMPLANT
PAD OB MATERNITY 4.3X12.25 (PERSONAL CARE ITEMS) ×2 IMPLANT
PAD PREP 24X41 OB/GYN DISP (PERSONAL CARE ITEMS) ×2 IMPLANT
RETRACTOR TRAXI PANNICULUS (MISCELLANEOUS) IMPLANT
SCRUB CHG 4% DYNA-HEX 4OZ (MISCELLANEOUS) ×1 IMPLANT
SUT MNCRL AB 4-0 PS2 18 (SUTURE) ×2 IMPLANT
SUT PLAIN 2 0 XLH (SUTURE) IMPLANT
SUT VIC AB 0 CT1 36 (SUTURE) ×8 IMPLANT
SUT VIC AB 2-0 CT1 27 (SUTURE) ×1
SUT VIC AB 2-0 CT1 TAPERPNT 27 (SUTURE) IMPLANT
SUT VIC AB 3-0 SH 27 (SUTURE) ×1
SUT VIC AB 3-0 SH 27X BRD (SUTURE) ×1 IMPLANT
SYSTEM CONTND EXTRCTN KII BLLN (MISCELLANEOUS) IMPLANT
TRAXI PANNICULUS RETRACTOR (MISCELLANEOUS) ×1
WATER STERILE IRR 500ML POUR (IV SOLUTION) ×2 IMPLANT

## 2021-10-22 NOTE — Anesthesia Procedure Notes (Signed)
Epidural ?Patient location during procedure: OB ?Start time: 10/22/2021 1:47 AM ?End time: 10/22/2021 1:47 AM ? ?Staffing ?Anesthesiologist: Iran Ouch, MD ?Performed: anesthesiologist  ? ?Preanesthetic Checklist ?Completed: patient identified, IV checked, site marked, risks and benefits discussed, surgical consent, monitors and equipment checked, pre-op evaluation and timeout performed ? ?Epidural ?Patient position: sitting ?Prep: Betadine ?Patient monitoring: heart rate, continuous pulse ox and blood pressure ?Approach: midline ?Location: L3-L4 ?Injection technique: LOR saline ? ?Needle:  ?Needle type: Tuohy  ?Needle gauge: 18 G ?Needle length: 9 cm and 9 ?Needle insertion depth: 7 cm ?Catheter type: closed end flexible ?Catheter size: 20 Guage ?Catheter at skin depth: 13 cm ? ?Assessment ?Events: blood not aspirated, injection not painful, no injection resistance, no paresthesia and negative IV test ? ?Additional Notes ?CSE performed with CSE kit for surgical anesthesia ? ?Patient tolerated the insertion well without complications.Reason for block:surgical anesthesia ? ? ? ?

## 2021-10-22 NOTE — Lactation Note (Signed)
This note was copied from a baby's chart. ?Lactation Consultation Note ? ?Patient Name: Kristina Mccall ?Today's Date: 10/22/2021 ?Reason for consult: Initial assessment;Primapara;Early term 37-38.6wks;RN request;Breastfeeding assistance;Other (Comment) (c-section; breech) ?Age:36 hours ? ?P1, c-section for breech presentation ? ?Maternal Data ?Has patient been taught Hand Expression?: Yes ?Does the patient have breastfeeding experience prior to this delivery?: No ? ?LC taught and demonstrated hand expression this visit. Previously noted flat nipples did hard and somewhat evert with nipple rolling and hand expression. ? ?Feeding ?Mother's Current Feeding Choice: Breast Milk ? ?Feedings documented since delivery with use of 60mm nipple shield (shield appears appropriate for mom, TBD for baby's mouth) ? ?LATCH Score ?Latch: Repeated attempts needed to sustain latch, nipple held in mouth throughout feeding, stimulation needed to elicit sucking reflex. ? ?Audible Swallowing: None ? ?Type of Nipple: Flat (with nipple rolling the nipple does harden and evert somewhat; nipple shield able to adhere) ? ?Comfort (Breast/Nipple): Soft / non-tender ? ?Hold (Positioning): Assistance needed to correctly position infant at breast and maintain latch. ? ?LATCH Score: 5 ? ?Mom had baby attempting to feed upon entry. Baby was sleepy, and non-cuing despite efforts. LC assisted with nipple rolling and placement of shield; baby did accept shield but did not have activity at the breast. ? ?Lactation Tools Discussed/Used ?  ? ?Interventions ?Interventions: Breast feeding basics reviewed;Assisted with latch;Hand express;Reverse pressure;Support pillows;Education (nipple rolling, use of nipple shield, enjoy booklet, typical feeding patterns, early cues) ? ?Discharge ?  ? ?Consult Status ?Consult Status: Follow-up ?Date: 10/22/21 ?Follow-up type: In-patient (encouraged parents to call at next attempt) ? ?Number placed on whiteboard;  instructed to call with next feeding attempt in 2-3 hours. Encouraged hand expression for stimulation, possible spoon feeding if needed. ? ?Danford Bad ?10/22/2021, 9:45 AM ? ? ? ?

## 2021-10-22 NOTE — Op Note (Signed)
Cesarean Section Procedure Note ? ?Indications: malpresentation: breech, with premature ruptured membranes at term ? ?Pre-operative Diagnosis: 38 week 2 day pregnancy, breech presentation, morbid obesity (BMI 58), advanced maternal age, marginal cord insertion. ? ?Post-operative Diagnosis: Same ? ?Surgeon: Hildred Laser, MD ? ?Assistants:  Brennan Bailey, MD ?  ?Procedure: Primary transverse Cesarean Section ? ?Anesthesia: Combined spinal-epidural anesthesia ? ?Findings: ?Female infant, cephalic presentation, 3510 grams, with Apgar scores of 8 at one minute and 9 at five minutes. ?Triple nuchal cord, reduced after delivery of fetal head.  ?Intact placenta with 3 vessel cord. Marginal cord insertion.  ?Clear amniotic fluid at amniotomy.  ?Thin uterine muscle layer.  ?The uterine outline, tubes and ovaries appeared normal.  ? ?Procedure Details: ?The patient was seen in the Holding Room. The risks, benefits, complications, treatment options, and expected outcomes were discussed with the patient.  The patient concurred with the proposed plan, giving informed consent.  The site of surgery properly noted/marked. The patient was taken to the Operating Room, identified as Elvis Coil and the procedure verified as C-Section Delivery. A Time Out was held and the above information confirmed. ? ?After induction of anesthesia, the patient was draped and prepped in the usual sterile manner. Anesthesia was tested and noted to be adequate. A Pfannenstiel incision was made and carried down through the subcutaneous tissue to the fascia. Fascial incision was made and extended transversely. The fascia was separated from the underlying rectus tissue superiorly and inferiorly. The peritoneum was identified and entered. Peritoneal incision was extended longitudinally. The surgical assist was able to provide retraction to allow for clear visualization of surgical site. The utero-vesical peritoneal reflection was incised transversely and  the bladder flap was bluntly freed from the lower uterine segment. A low transverse uterine incision was made. Delivered from frank breech presentation was a 3510 gram Female with Apgar scores of 8 at one minute and 9 at five minutes.  The assistant was able to apply adequate fundal pressure to allow for successful delivery of the fetus.  A triple nuchal cord was encountered, and reduced after delivery of the fetal head.  The umbilical cord was clamped and cut, no cord blood was collected. Delayed cord clamping was observed. The placenta was removed intact and had a marginal cord insertion but otherwise normal. The uterus was exteriorized and cleared of all clots and debris. The uterine outline, tubes and ovaries appeared normal.  The uterine incision was closed with running locked sutures of 0-Vicryl.  A second suture of 0-Vicryl was used in an imbricating layer.  Hemostasis was observed. The uterus was then returned to the abdomen. The pericolic gutters were cleared of all clots and debris.  The fascia was then reapproximated with a running suture of 0-Vicryl. A total of 400 mg of the Zynrelef, topical anesthetic, was applied to the fascial tissue. The subcutaneous fat layer was reapproximated in 3 layers, using 2-0 Vicryl to close off any dead space. The skin was reapproximated with 4-0 Vicryl. The skin and subcutaneous tissues were then injected with an additional 40 ml of the 0.5% Bupivicaine. The incision was covered with steri-strips and a honey comb dressing.  An abdominal binder was placed.  ? ?Instrument, sponge, and needle counts were correct prior the abdominal closure and at the conclusion of the case.  ? ?An experienced assistant was required given the standard of surgical care given the complexity of the case.  This assistant was needed for exposure, dissection, suctioning, retraction, instrument exchange, and for  overall help during the procedure. ? ? ?Estimated Blood Loss:  428 ml (QBL) ?      ?Drains: foley catheter to gravity drainage, 50 ml of clear urine at end of the procedure ?        ?Total IV Fluids: 1350 ml ? ?Specimens: None ?        ?Implants: None ?        ?Complications:  None; patient tolerated the procedure well. ?        ?Disposition: PACU - hemodynamically stable. ?        ?Condition: stable ? ? ?Hildred Laser, MD ?[facility] ? ? ? ?

## 2021-10-22 NOTE — Anesthesia Postprocedure Evaluation (Signed)
Anesthesia Post Note ? ?Patient: Kristina Mccall ? ?Procedure(s) Performed: CESAREAN SECTION PRIMARY LOW-TRANSVERSE ? ?Patient location during evaluation: Mother Baby ?Anesthesia Type: Spinal ?Level of consciousness: oriented and awake and alert ?Pain management: pain level controlled ?Vital Signs Assessment: post-procedure vital signs reviewed and stable ?Respiratory status: spontaneous breathing and respiratory function stable ?Cardiovascular status: blood pressure returned to baseline and stable ?Postop Assessment: no headache, no backache, no apparent nausea or vomiting and able to ambulate ?Anesthetic complications: no ? ? ?No notable events documented. ? ? ?Last Vitals:  ?Vitals:  ? 10/22/21 0616 10/22/21 0749  ?BP: 126/90 (!) 112/58  ?Pulse: (!) 107 77  ?Resp: 18 (!) 24  ?Temp: 36.9 ?C (!) 36.4 ?C  ?SpO2: 96% 97%  ?  ?Last Pain:  ?Vitals:  ? 10/22/21 0749  ?TempSrc: Oral  ?PainSc:   ? ? ?  ?  ?  ?  ?  ?  ? ?Ahonesty Woodfin Joanette Gula ? ? ? ? ?

## 2021-10-22 NOTE — Anesthesia Post-op Follow-up Note (Signed)
?  Anesthesia Pain Follow-up Note ? ?Patient: Kristina Mccall ? ?Day #: 1 ? ?Date of Follow-up: 10/22/2021 Time: 8:10 AM ? ?Last Vitals:  ?Vitals:  ? 10/22/21 0616 10/22/21 0749  ?BP: 126/90 (!) 112/58  ?Pulse: (!) 107 77  ?Resp: 18 (!) 24  ?Temp: 36.9 ?C (!) 36.4 ?C  ?SpO2: 96% 97%  ? ? ?Level of Consciousness: alert ? ?Pain: none  ? ?Side Effects:None ? ?Catheter Site Exam:clean ? ?Anti-Coag Meds (From admission, onward)  ? Start     Dose/Rate Route Frequency Ordered Stop  ? 10/23/21 0700  enoxaparin (LOVENOX) injection 40 mg       ? 40 mg Subcutaneous Every 24 hours 10/22/21 0551    ?  ? ? ? ?Plan: D/C from anesthesia care at surgeon's request ? ?Maitri Schnoebelen Joanette Gula ? ? ? ?

## 2021-10-22 NOTE — Lactation Note (Signed)
This note was copied from a baby's chart. ?Lactation Consultation Note ? ?Patient Name: Girl Reneka Nebergall ?Today's Date: 10/22/2021 ?Reason for consult: Follow-up assessment;Mother's request;Primapara;Early term 37-38.6wks;RN request;Breastfeeding assistance;Other (Comment) (breech) ?Age:36 hours ? ?LC follow-up ? ?Maternal Data ?Has patient been taught Hand Expression?: Yes ?Does the patient have breastfeeding experience prior to this delivery?: No ? ?Feeding ?Mother's Current Feeding Choice: Breast Milk ? ?Baby has been sleepy for most of the day. Bath recently given and baby more alert and lightly cuing.  ?RN had set-up pump earlier due to baby being sleepy for additional stimulation. Colostrum noted on both flanges. ? ?LATCH Score ?Latch: Repeated attempts needed to sustain latch, nipple held in mouth throughout feeding, stimulation needed to elicit sucking reflex. ? ?Audible Swallowing: Spontaneous and intermittent (as the feeding progressed) ? ?Type of Nipple: Flat (slightly everts/hardens with nipple rolling) ? ?Comfort (Breast/Nipple): Soft / non-tender ? ?Hold (Positioning): Assistance needed to correctly position infant at breast and maintain latch. ? ?LATCH Score: 7 ? ?LC assisted with nipple rolling/everting nipple, placement of nipple shield and support pillows. Baby brought to breast on R side, a few off/on before sustaining latch. Initially baby required continuous stimulation but then progressed into rhythmic sucking pattern with audible swallows. ? ?Lactation Tools Discussed/Used ?Tools: Nipple Dorris Carnes;Pump ?Nipple shield size: 24 ?Breast pump type: Double-Electric Breast Pump ?Pump Education:  (given by RN) ?Reason for Pumping: stimulation while baby is sleepy ?Pumping frequency: 1x so far ? ?Interventions ?Interventions: Breast feeding basics reviewed;Assisted with latch;Breast massage;Hand express;Breast compression;Support pillows;Position options;Education (nipple shield) ? ?LC reviewed with  parents tips for keeping baby awake at the breast and signs that baby was content/done. Encouraged ongoing frequent attempts and use of pump as needed if baby is sleepy. Briefly reiterated the normal feeding patterns for baby <24hrs and also breech. ? ?Discharge ?  ? ?Consult Status ?Consult Status: Follow-up ?Date: 10/23/21 ?Follow-up type: In-patient ? ? ? ?Danford Bad ?10/22/2021, 4:39 PM ? ? ? ?

## 2021-10-22 NOTE — Transfer of Care (Signed)
Immediate Anesthesia Transfer of Care Note ? ?Patient: Kristina Mccall ? ?Procedure(s) Performed: CESAREAN SECTION PRIMARY LOW-TRANSVERSE ? ?Patient Location: PACU ? ?Anesthesia Type:Spinal ? ?Level of Consciousness: awake, alert , oriented and patient cooperative ? ?Airway & Oxygen Therapy: Patient Spontanous Breathing ? ?Post-op Assessment: Post -op Vital signs reviewed and stable ? ?Post vital signs: Reviewed and stable ? ?Last Vitals:  ?Vitals Value Taken Time  ?BP    ?Temp    ?Pulse    ?Resp    ?SpO2    ? ? ?Last Pain:  ?Vitals:  ? 10/21/21 2335  ?TempSrc: Oral  ?PainSc:   ?   ? ?  ? ?Complications: No notable events documented. ?

## 2021-10-23 ENCOUNTER — Other Ambulatory Visit: Payer: Managed Care, Other (non HMO)

## 2021-10-23 ENCOUNTER — Encounter: Payer: Managed Care, Other (non HMO) | Admitting: Obstetrics and Gynecology

## 2021-10-23 DIAGNOSIS — Z3A38 38 weeks gestation of pregnancy: Secondary | ICD-10-CM

## 2021-10-23 DIAGNOSIS — O0993 Supervision of high risk pregnancy, unspecified, third trimester: Secondary | ICD-10-CM

## 2021-10-23 LAB — CREATININE, SERUM
Creatinine, Ser: 0.66 mg/dL (ref 0.44–1.00)
GFR, Estimated: >60 mL/min (ref >60)

## 2021-10-23 MED ORDER — ACETAMINOPHEN 500 MG PO TABS: 1000.0000 mg | ORAL_TABLET | Freq: Four times a day (QID) | ORAL | 1 refills | Status: DC | PRN

## 2021-10-23 MED ORDER — DOCUSATE SODIUM 100 MG PO CAPS: 100.0000 mg | ORAL_CAPSULE | Freq: Two times a day (BID) | ORAL | 2 refills | Status: DC | PRN

## 2021-10-23 MED ORDER — IBUPROFEN 600 MG PO TABS: 600.0000 mg | ORAL_TABLET | Freq: Four times a day (QID) | ORAL | 1 refills | Status: DC

## 2021-10-23 MED ORDER — OXYCODONE HCL 5 MG PO TABS: 5.0000 mg | ORAL_TABLET | ORAL | 0 refills | Status: DC | PRN

## 2021-10-23 NOTE — Lactation Note (Signed)
This note was copied from a baby's chart. ?Lactation Consultation Note ? ?Patient Name: Kristina Mccall ?Today's Date: 10/23/2021 ?Reason for consult: Follow-up assessment;Primapara;Mother's request ?Age:36 hours ? ?Maternal Data ?  ? ?Feeding ?Mother's Current Feeding Choice: Breast Milk ?Mom requested help with latching baby to right breast, able to latch to right without nipple shield this feeding, baby sleepy but took a few attempts to get a deep latch with breast shaping and for baby to maintain latch, once latch maintained, dad was able to assist mom with maintaining latch, baby was continuing to nurse on right at 10 min when [facility] left room ?LATCH Score ?Latch: Repeated attempts needed to sustain latch, nipple held in mouth throughout feeding, stimulation needed to elicit sucking reflex. ? ?Audible Swallowing: A few with stimulation ? ?Type of Nipple: Flat ? ?Comfort (Breast/Nipple): Soft / non-tender ? ?Hold (Positioning): Assistance needed to correctly position infant at breast and maintain latch. ? ?LATCH Score: 6 ? ? ?Lactation Tools Discussed/Used ?  ? ?Interventions ?Interventions: Assisted with latch;Skin to skin;Hand express;Breast compression;Support pillows;Education ? ?Discharge ?  ? ?Consult Status ?Consult Status: PRN ?Date: 10/23/21 ?Follow-up type: In-patient ? ? ? ?[name] ?10/23/2021, 3:13 PM ? ? ? ?

## 2021-10-23 NOTE — Lactation Note (Signed)
This note was copied from a baby's chart. ?Lactation Consultation Note ? ?Patient Name: Girl Amelita Risinger ?Today's Date: 10/23/2021 ?Reason for consult: Follow-up assessment;Primapara ?Age:36 hours ? ?Maternal Data ?  ? ?Feeding ?Mother's Current Feeding Choice: Breast Milk ?Nipple Type: Slow - flow ?Assisted with latching baby to left breast in cradle hold, mom needs extra hand in getting deep latch, but baby nurses well once deep on breast and swallows are heard, nursed 10 min , came off breast, burped and assisted with latching to right breast and is now nursing well with stimulation.  No nipple shield needed this feeding, mom uses manual pump to evert nipples before latching.      ?LATCH Score ?Latch: Repeated attempts needed to sustain latch, nipple held in mouth throughout feeding, stimulation needed to elicit sucking reflex. ? ?Audible Swallowing: Spontaneous and intermittent ? ?Type of Nipple: Flat ? ?Comfort (Breast/Nipple): Soft / non-tender ? ?Hold (Positioning): Assistance needed to correctly position infant at breast and maintain latch. ? ?LATCH Score: 7 ? ? ?Lactation Tools Discussed/Used ?  ? ?Interventions ?Interventions: Assisted with latch;Skin to skin ? ?Discharge ?  ? ?Consult Status ?Consult Status: Follow-up ?Date: 10/24/21 ?Follow-up type: In-patient ? ? ? ?[name] ?10/23/2021, 5:55 PM ? ? ? ?

## 2021-10-23 NOTE — Discharge Summary (Signed)
? ?  Postpartum Discharge Summary ? ? ?   ?Patient Name: Kristina Mccall ?DOB: Nov 03, 1985 ?MRN: [id number] ? ?Date of admission: 10/21/2021 ?Delivery date:10/22/2021  ?Delivering provider: Rubie Maid  ?Date of discharge: 10/23/2021 ? ?Admitting diagnosis: PROM (premature rupture of membranes) [O42.90] ?Intrauterine pregnancy: [redacted]w[redacted]d    ?Secondary diagnosis:  Principal Problem: ?  Breech presentation, no version ?Active Problems: ?  BMI 50.0-59.9, [place] ?  Advanced maternal age, 1st pregnancy, third trimester ?  Marginal insertion of umbilical cord affecting management of mother ?  Excessive fetal growth affecting management of mother in third trimester, antepartum ?  PROM (premature rupture of membranes) ? ?Additional problems: Heart palpitations, history of anxiety and depression    ?Discharge diagnosis: Term Pregnancy Delivered and Anemia                                              ?Post partum procedures: None ?Augmentation: N/A ?Complications: None ? ?Hospital course: Onset of Labor With Unplanned C/S   ?36y.o. yo G1P1001 at 328w2das admitted in Latent Labor with PROM on 10/21/2021. The patient went for cesarean section due to Malpresentation. Delivery details as follows: ?Membrane Rupture Time/Date: 6:00 PM ,10/21/2021   ?Delivery Method:C-Section, Low Transverse  ?Details of operation can be found in separate operative note. Patient had an uncomplicated postpartum course.  She is ambulating,tolerating a regular diet, passing flatus, and urinating well.  Patient is discharged home in stable condition 10/24/21. ? ?Newborn Data: ?Birth date:10/22/2021  ?Birth time:2:16 AM  ?Gender:Female  ?Living status:Living  ?Apgars:8 ,9  ?Weight:3510 g  ? ?Magnesium Sulfate received: No ?BMZ received: No ?Rhophylac:No ?MMR:No ?T-DaP:Given prenatally ?Flu: No ?Transfusion:No ? ?Physical exam  ?Vitals:  ? 10/23/21 0744 10/23/21 1619 10/23/21 2346 10/24/21 0837  ?BP: 108/64 129/70 (!) 121/59 114/60  ?Pulse: 75 93 84 84   ?Resp: 18 18 18 20   ?Temp: 97.9 ?F (36.6 ?C) 98 ?F (36.7 ?C) 98.9 ?F (37.2 ?C) 98.1 ?F (36.7 ?C)  ?TempSrc: Oral Oral Oral Oral  ?SpO2: 97% 100% 99% 96%  ?Weight:      ?Height:      ? ?General: alert, cooperative, and no distress ?Lochia: appropriate ?Uterine Fundus: firm ?Incision: Healing well with no significant drainage, No significant erythema, Dressing is clean, dry, and intact ?DVT Evaluation: No evidence of DVT seen on physical exam. Negative Homan's sign. ?No cords or calf tenderness.  No significant calf/ankle edema. ? ? ?Labs: ?Lab Results  ?Component Value Date  ? WBC 18.0 (H) 10/22/2021  ? HGB 11.1 (L) 10/22/2021  ? HCT 33.2 (L) 10/22/2021  ? MCV 87.4 10/22/2021  ? PLT 277 10/22/2021  ? ? ?Edinburgh Score: ? ?  10/22/2021  ? 12:40 PM  ?Edinburgh Postnatal Depression Scale Screening Tool  ?I have been able to laugh and see the funny side of things. 0  ?I have looked forward with enjoyment to things. 1  ?I have blamed myself unnecessarily when things went wrong. 1  ?I have been anxious or worried for no good reason. 2  ?I have felt scared or panicky for no good reason. 1  ?Things have been getting on top of me. 2  ?I have been so unhappy that I have had difficulty sleeping. 0  ?I have felt sad or miserable. 1  ?I have been so unhappy that I have been crying. 0  ?  The thought of harming myself has occurred to me. 0  ?Edinburgh Postnatal Depression Scale Total 8  ? ? ? ? ?After visit meds:  ?Allergies as of 10/24/2021   ? ?   Reactions  ? Amoxicillin Nausea And Vomiting  ? Other reaction(s): GI intolerance  ? Ciprofloxacin Swelling  ? Pitting edema  ? Doxycycline Nausea And Vomiting  ? Hydrocodone Hives  ? Methylprednisolone Sodium Succ Hives  ? Other Hives  ? methylprednisone  ? Penicillins Nausea And Vomiting  ? Latex Rash  ? ?  ? ?  ?Medication List  ?  ? ?TAKE these medications   ? ?acetaminophen 500 MG tablet ?Commonly known as: TYLENOL ?Take 2 tablets (1,000 mg total) by mouth every 6 (six) hours as  needed. ?  ?aspirin EC 81 MG tablet ?Take 81 mg by mouth daily. Swallow whole. ?  ?docusate sodium 100 MG capsule ?Commonly known as: COLACE ?Take 1 capsule (100 mg total) by mouth 2 (two) times daily as needed. ?  ?docusate sodium 100 MG capsule ?Commonly known as: COLACE ?Take 1 capsule (100 mg total) by mouth 2 (two) times daily as needed. ?  ?ibuprofen 600 MG tablet ?Commonly known as: ADVIL ?Take 1 tablet (600 mg total) by mouth every 6 (six) hours. ?  ?oxyCODONE 5 MG immediate release tablet ?Commonly known as: Oxy IR/ROXICODONE ?Take 1-2 tablets (5-10 mg total) by mouth every 4 (four) hours as needed for moderate pain or severe pain. ?  ?prenatal multivitamin Tabs tablet ?Take 1 tablet by mouth daily at 12 noon. ?  ? ?  ? ? ? ?Discharge home in stable condition ?Infant Feeding: Breast ?Infant Disposition:home with mother ?Discharge instruction: per After Visit Summary and Postpartum booklet. ?Activity: Advance as tolerated. Pelvic rest for 6 weeks.  ?Diet: routine diet ?Anticipated Birth Control: IUD ?Postpartum Appointment:6 weeks ?Additional Postpartum F/U: Postpartum Depression checkup in 2 weeks ?Future Appointments: ?Future Appointments  ?Date Time Provider [place]  ?10/27/2021  2:15 PM [facility] None  ?12/02/2021  8:40 AM Tobb, Godfrey Pick, DO [facility]  ? ?Follow up Visit: ? Follow-up Information   ? ? Rubie Maid, MD Follow up.   ?Specialties: Obstetrics and Gynecology, Radiology ?Why: 1-2 week incision check ?6 weeks postpartum check ?Contact information: ?[address] ?[address] ?[place] [address] ?[phone] ? ? ?  ?  ? ?  ?  ? ?  ? ? ? ?  ? ?10/23/2021 ?Rubie Maid, MD ? ? ?

## 2021-10-23 NOTE — Lactation Note (Addendum)
This note was copied from a baby's chart. ?Lactation Consultation Note ? ?Patient Name: Kristina Mccall ?Today's Date: 10/23/2021 ?Reason for consult: L&D Initial assessment;Primapara;Early term 37-38.6wks ?Age:36 hours ? ?Maternal Data ?Has patient been taught Hand Expression?: Yes ?Does the patient have breastfeeding experience prior to this delivery?: No ? ?Feeding ?Mother's Current Feeding Choice: Breast Milk and Formula ?Mom has flat nipples but able to shape to get baby to latch to left breast, after colostrum expressed, baby nursed well x 20 min with audible swallows, attempts made on right to latch, breast shaped, manual pump used to elongate nipple, baby still unable to maintain latch, fussy and wants to be on breast but falls asleep quickly after latching, 24 mm nipple shield applied with baby in football hold and baby was then able to maintain latch, nursed 25 min with swallows noted and colostrum noted in nipple shield.     ?LATCH Score ?Latch: Grasps breast easily, tongue down, lips flanged, rhythmical sucking. ? ?Audible Swallowing: A few with stimulation ? ?Type of Nipple: Flat ? ?Comfort (Breast/Nipple): Soft / non-tender ? ?Hold (Positioning): Full assist, staff holds infant at breast ? ?LATCH Score: 6 ? ? ?Lactation Tools Discussed/Used ?Tools: Pump;Nipple Dorris Carnes ?Nipple shield size: 24 (use on right breast) ?Breast pump type: Manual ?Reason for Pumping: to evert nipple on right ? ?Interventions ?Interventions: Breast feeding basics reviewed;Assisted with latch;Skin to skin;Hand express;Pre-pump if needed;Adjust position;Breast compression;Support pillows;Position options;Hand pump;Education ?LC name updated on white board ?Discharge ?Pump: Personal;DEBP;Manual ?WIC Program: No ? ?Consult Status ?Consult Status: PRN ?Date: 10/23/21 ?Follow-up type: In-patient ? ? ? ?[name] ?10/23/2021, 11:42 AM ? ? ? ?

## 2021-10-23 NOTE — Progress Notes (Signed)
Postpartum Day # 1: Cesarean Delivery ? ?Subjective: ?Patient reports tolerating PO, + flatus, and no problems voiding. Notes bleeding has slowed down. Doing better with breastfeeding.  ? ?Objective: ?Vital signs in last 24 hours: ?Temp:  [97.5 ?F (36.4 ?C)-98.9 ?F (37.2 ?C)] 98.1 ?F (36.7 ?C) (04/27 2240) ?Pulse Rate:  [77-115] 89 (04/27 2240) ?Resp:  [18-24] 18 (04/27 2240) ?BP: (112-135)/(57-70) 135/57 (04/27 2240) ?SpO2:  [95 %-99 %] 99 % (04/28 0100) ? ?Physical Exam:  ?General: alert and no distress ?Lungs: clear to auscultation bilaterally ?Breasts: normal appearance, no masses or tenderness ?Heart: regular rate and rhythm, S1, S2 normal, no murmur, click, rub or gallop ?Abdomen: soft, non-tender; bowel sounds normal; no masses,  no organomegaly ?Pelvis: Lochia appropriate, Uterine Fundus firm, Incision: healing well, no significant drainage, no dehiscence, no significant erythema ?Extremities: DVT Evaluation: No evidence of DVT seen on physical exam. ?Negative Homan's sign. ?No cords or calf tenderness. ?No significant calf/ankle edema. ? ?Recent Labs  ?  10/21/21 ?2003 10/22/21 ?0705  ?HGB 12.2 11.1*  ?HCT 36.9 33.2*  ? ? ?Assessment/Plan: ?Status post Cesarean section. Doing well postoperatively.  ?Breastfeeding, Lactation consult ?Contraception IUD ?Encourage ambulation ?Lovenox PP for DVT prophylaxis ?Regular diet ?PO pain management.  ?Continue current care. Can d/c home later today if desired   ? ?Rubie Maid, MD ?[facility] ? ? ? ? ? ?

## 2021-10-24 NOTE — Progress Notes (Signed)
Patient discharged with infant. Discharge instructions, prescriptions, and follow up appointments given to and reviewed with patient. Patient verbalized understanding. Escorted out by staff. 

## 2021-10-26 ENCOUNTER — Ambulatory Visit (INDEPENDENT_AMBULATORY_CARE_PROVIDER_SITE_OTHER): Payer: Managed Care, Other (non HMO) | Admitting: Obstetrics and Gynecology

## 2021-10-26 ENCOUNTER — Encounter: Payer: Self-pay | Admitting: Obstetrics and Gynecology

## 2021-10-26 ENCOUNTER — Telehealth: Payer: Self-pay | Admitting: Obstetrics and Gynecology

## 2021-10-26 VITALS — BP 127/83 | HR 103 | Wt 330.0 lb

## 2021-10-26 DIAGNOSIS — Z0130 Encounter for examination of blood pressure without abnormal findings: Secondary | ICD-10-CM | POA: Diagnosis not present

## 2021-10-26 DIAGNOSIS — R609 Edema, unspecified: Secondary | ICD-10-CM

## 2021-10-26 MED ORDER — FUROSEMIDE 20 MG PO TABS: 20.0000 mg | ORAL_TABLET | Freq: Every day | ORAL | 0 refills | Status: DC

## 2021-10-26 NOTE — Telephone Encounter (Signed)
Please send in Lasix 20 mg for her to take daily for the next 5 days. Also encourage drinking some caffeine, and can take up to 2 tablets of her oxycodone to see if it will knock out her headache.

## 2021-10-26 NOTE — Progress Notes (Signed)
Patient came in for a blood pressure check. Her blood pressure nml. 127/83 with a heart rate of 103. She has headache and swelling in her feet. She has been taking Ibuprofen and Oxycodone with no relief. Sent in Lasix 20 mg daily for 5 days. Also recommended to take 2 oxycodone to help relieve headaches. Called patient to advise, no answer lvm and sent mychart message. ?

## 2021-10-26 NOTE — Telephone Encounter (Signed)
Pt called stating that she is concerned about a severe headache that she has been experiencing - she has c-section on 5/27, she has been having swelling in feet and legs- states that she has been using ibuprofen and it is not helping. Consulted CB- stated okay for pt to come in for bp check as pt requested.   ?

## 2021-10-27 ENCOUNTER — Other Ambulatory Visit: Payer: Managed Care, Other (non HMO)

## 2021-10-27 NOTE — Telephone Encounter (Signed)
Patient will be coming in tomorrow at 11:30 for incision check.

## 2021-10-28 ENCOUNTER — Encounter: Payer: Self-pay | Admitting: Obstetrics and Gynecology

## 2021-10-28 ENCOUNTER — Ambulatory Visit: Payer: Managed Care, Other (non HMO) | Admitting: Obstetrics and Gynecology

## 2021-10-28 VITALS — BP 136/81 | HR 105 | Resp 16 | Ht 63.0 in | Wt 320.9 lb

## 2021-10-28 DIAGNOSIS — L231 Allergic contact dermatitis due to adhesives: Secondary | ICD-10-CM

## 2021-10-28 DIAGNOSIS — R600 Localized edema: Secondary | ICD-10-CM

## 2021-10-28 DIAGNOSIS — Z5189 Encounter for other specified aftercare: Secondary | ICD-10-CM

## 2021-10-28 DIAGNOSIS — Z6841 Body Mass Index (BMI) 40.0 and over, adult: Secondary | ICD-10-CM

## 2021-10-28 DIAGNOSIS — Z98891 History of uterine scar from previous surgery: Secondary | ICD-10-CM

## 2021-10-28 NOTE — Progress Notes (Signed)
? ? ?  OBSTETRICS/GYNECOLOGY POST-OPERATIVE CLINIC VISIT ? ?Subjective:  ?  ? Kristina Mccall is a 36 y.o. female who presents to the clinic 1 weeks status post  primary low-transverse C-section  for  breech presentation with PROM at [redacted] weeks gestation . Eating a regular diet without difficulty. Bowel movements are normal. Pain is controlled with current analgesics. Medications being used: prescription NSAID's including Ibuprofen.  ? ?Patient is breastfeeding. Notes vaginal bleeding is light.  Does still note some swelling in her legs, but is improving with use of Lasix and compression stockings (just initiated 2 days ago). Does note increased tearfulness over the past week but overall feels fine.  Edinburgh postpartum depression screen is negative, score is 6.  ? ?The following portions of the patient's history were reviewed and updated as appropriate: allergies, current medications, past family history, past medical history, past social history, past surgical history, and problem list. ? ?Review of Systems ?Pertinent items noted in HPI and remainder of comprehensive ROS otherwise negative. ?  ?Objective:  ? ?BP 136/81   Pulse (!) 105   Resp 16   Ht 5\' 3"  (1.6 m)   Wt (!) 320 lb 14.4 oz (145.6 kg)   BMI 56.84 kg/m?  Body mass index is 56.84 kg/m?. ? ?General:  alert and no distress  ?Abdomen: soft, bowel sounds active, non-tender  ?Incision:   healing well, no drainage, no erythema, no hernia, no seroma, no swelling, no dehiscence, incision well approximated.  Skin edges beyond incision on right and in groin on left with macular rash  ?Extremity: Non-tender, trace to +1 pitting edema of legs bilaterally to calf.   ? ? ?  10/28/2021  ? 11:49 AM 10/22/2021  ? 12:40 PM  ?Edinburgh Postnatal Depression Scale Screening Tool  ?I have been able to laugh and see the funny side of things. 0 0  ?I have looked forward with enjoyment to things. 0 1  ?I have blamed myself unnecessarily when things went wrong. 0 1  ?I have been  anxious or worried for no good reason. 2 2  ?I have felt scared or panicky for no good reason. 1 1  ?Things have been getting on top of me. 1 2  ?I have been so unhappy that I have had difficulty sleeping. 0 0  ?I have felt sad or miserable. 0 1  ?I have been so unhappy that I have been crying. 0 0  ?The thought of harming myself has occurred to me. 0 0  ?Edinburgh Postnatal Depression Scale Total 4 8  ?  ? ?Pathology:  ?None ? ?Assessment:  ? 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 ?Plan:  ? ?1. Continue any current medications as instructed by provider. ?2. Incision healing well. Wound care discussed. Honeycomb dressing and steri-strips removed.  ?3.  Allergic dermatitis, advised on use of cortisone cream topically 1-2 times daily until resolution of rash.  ?4. Activity restrictions: no bending, stooping, or squatting, no lifting more than 10-15 pounds, and pelvic rest ?5. Anticipated return to work: 4 weeks. ?6. Follow up: 4-5 weeks for postpartum visit.  ? ? ? ?10/24/2021, MD ?[facility] ? ? ? ?

## 2021-10-28 NOTE — Progress Notes (Signed)
Patient is 1 week post-op from C-section. Concerned about her incision as it was warm and red yesterday.  ? ? ?Kristina Mccall, CMA ? ? ? ? ?  ?

## 2021-10-30 ENCOUNTER — Inpatient Hospital Stay: Admit: 2021-10-30 | Payer: Managed Care, Other (non HMO) | Admitting: Obstetrics and Gynecology

## 2021-11-02 ENCOUNTER — Encounter: Payer: Self-pay | Admitting: Obstetrics and Gynecology

## 2021-11-05 ENCOUNTER — Encounter: Payer: Managed Care, Other (non HMO) | Admitting: Obstetrics and Gynecology

## 2021-11-10 ENCOUNTER — Encounter: Payer: Self-pay | Admitting: Obstetrics and Gynecology

## 2021-11-13 MED ORDER — NYSTATIN-TRIAMCINOLONE 100000-0.1 UNIT/GM-% EX OINT: 1.0000 "application " | TOPICAL_OINTMENT | Freq: Two times a day (BID) | CUTANEOUS | 0 refills | Status: DC

## 2021-12-02 ENCOUNTER — Telehealth (INDEPENDENT_AMBULATORY_CARE_PROVIDER_SITE_OTHER): Payer: Managed Care, Other (non HMO) | Admitting: Cardiology

## 2021-12-02 VITALS — Ht 63.0 in | Wt 303.0 lb

## 2021-12-02 DIAGNOSIS — I471 Supraventricular tachycardia: Secondary | ICD-10-CM | POA: Diagnosis not present

## 2021-12-02 NOTE — Progress Notes (Signed)
Cardio-Obstetrics Clinic  Follow Up Note   Date:  12/06/2021   ID:  Kristina Mccall, DOB 1985/08/18, MRN [id number]  PCP:  Patient, No Pcp Per (Inactive)   [facility] Providers Cardiologist:  Berniece Salines, DO  Electrophysiologist:  None       She is at home. I am in the office.  Virtual Visit via Video  Note . I connected with the patient today by a   video enabled telemedicine application and verified that I am speaking with the correct person using two identifiers.  Referring MD: No ref. provider found   Chief Complaint: " I am ok"  History of Present Illness:    Kristina Mccall is a 36 y.o. female [id number] who returns for follow up of  post partum visit.  Medical history for paroxysmal SVT, morbid obesity here today for follow-up visit.  Last saw the patient on October 07, 2021.  During that visit we discussed her monitor result.  No medications were added.  Since I saw the patient she has delivered her baby via C-section.  Postpartum she was giving a short course of Lasix.  She did well with this.  Unfortunately she did not buy the blood pressure cuff as advised.  I do not know what her blood pressure is today.  Prior CV Studies Reviewed: The following studies were reviewed today:   ZIO monitor   Patch Wear Time:  13 days and 21 hours (2022-12-16T14:11:44-0500 to 2022-12-30T11:26:00-0500)   Patient had a min HR of 56 bpm, max HR of 190 bpm, and avg HR of 88 bpm. Predominant underlying rhythm was Sinus Rhythm.   3 Supraventricular Tachycardia runs occurred, the run with the fastest interval lasting 13 beats with a max rate of 190 bpm (avg 161  bpm); the run with the fastest interval was also the longest. Isolated SVEs were rare (<1.0%), SVE Couplets were rare (<1.0%), and SVE Triplets were rare (<1.0%). Isolated VEs were rare (<1.0%), and no VE Couplets or VE Triplets were present. Ventricular  Bigeminy and Trigeminy were present.    Symptoms associated with sinus  rhythm and rare premature atrial complexes.   Conclusion: This study is remarkable for rare paroxysmal supraventricular tachycardia.  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; 02/11/2010;11/27/2012; 12/15/2012   positive high risk   LGSIL on Pap smear of cervix 03/18/2009   lgsil/hpv present    Past Surgical History:  Procedure Laterality Date   CESAREAN SECTION N/A 10/22/2021   Procedure: CESAREAN SECTION PRIMARY LOW-TRANSVERSE;  Surgeon: Rubie Maid, MD;  Location: [facility] ORS;  Service: Obstetrics;  Laterality: N/A;   COLPOSCOPY  2009/2010   no bx done   INTRAUTERINE DEVICE (IUD) INSERTION  10/24/2013      OB History     Gravida  1   Para  1   Term  1   Preterm  0   AB  0   Living  1      SAB  0   IAB  0   Ectopic  0   Multiple  0   Live Births  1               Current Medications: Current  Meds  Medication Sig   docusate sodium (COLACE) 100 MG capsule Take 1 capsule (100 mg total) by mouth 2 (two) times daily as needed.   docusate sodium (COLACE) 100 MG capsule Take 1 capsule (100 mg total) by mouth 2 (two) times daily as needed.   ibuprofen (ADVIL) 600 MG tablet Take 1 tablet (600 mg total) by mouth every 6 (six) hours.   nystatin-triamcinolone ointment (MYCOLOG) Apply 1 application. topically 2 (two) times daily.   Prenatal Vit-Fe Fumarate-FA (PRENATAL MULTIVITAMIN) TABS tablet Take 1 tablet by mouth daily at 12 noon.     Allergies:   Amoxicillin, Ciprofloxacin, Doxycycline, Hydrocodone, Methylprednisolone sodium succ, Other, Penicillins, and Latex   Social History   Socioeconomic History   Marital status: Married    Spouse name: Not on file   Number of children: Not on  file   Years of education: Not on file   Highest education level: Not on file  Occupational History   Occupation: Urgent Care  Tobacco Use   Smoking status: Never   Smokeless tobacco: Never  Vaping Use   Vaping Use: Never used  Substance and Sexual Activity   Alcohol use: Not Currently   Drug use: No   Sexual activity: Yes    Birth control/protection: I.U.D.  Other Topics Concern   Not on file  Social History Narrative   Not on file   Social Determinants of Health   Financial Resource Strain: Not on file  Food Insecurity: No Food Insecurity (06/12/2021)   Hunger Vital Sign    Worried About Running Out of Food in the Last Year: Never true    Ran Out of Food in the Last Year: Never true  Transportation Needs: No Transportation Needs (06/12/2021)   PRAPARE - Hydrologist (Medical): No    Lack of Transportation (Non-Medical): No  Physical Activity: Sufficiently Active (08/04/2017)   Exercise Vital Sign    Days of Exercise per Week: 4 days    Minutes of Exercise per Session: 60 min  Stress: Stress Concern Present (08/04/2017)   [facility]    Feeling of Stress : To some extent  Social Connections: Somewhat Isolated (08/04/2017)   Social Connection and Isolation Panel [NHANES]    Frequency of Communication with Friends and Family: More than three times a week    Frequency of Social Gatherings with Friends and Family: More than three times a week    Attends Religious Services: Never    Marine scientist or Organizations: No    Attends Music therapist: Never    Marital Status: Living with partner      Family History  Problem Relation Age of Onset   Cancer Mother        cervical   Hyperlipidemia Mother    Hypertension Mother    Hyperlipidemia Father    Diabetes Maternal Grandmother    Heart disease Maternal Grandfather       ROS:   Please see the history of  present illness.     All other systems reviewed and are negative.   Labs/EKG Reviewed:    EKG:   EKG is was not ordered today.    Recent Labs: 10/22/2021: Hemoglobin 11.1; Platelets 277 10/23/2021: Creatinine, Ser 0.66   Recent Lipid Panel Lab Results  Component Value Date/Time   CHOL 163 10/07/2017 08:57 AM   TRIG 56 10/07/2017 08:57 AM   HDL 58 10/07/2017 08:57 AM  CHOLHDL 2.8 10/07/2017 08:57 AM   LDLCALC 94 10/07/2017 08:57 AM    Physical Exam:    VS:  Ht 5\' 3"  (1.6 m)   Wt (!) 303 lb (137.4 kg)   BMI 53.67 kg/m     Wt Readings from Last 3 Encounters:  12/03/21 (!) 305 lb 12.8 oz (138.7 kg)  12/02/21 (!) 303 lb (137.4 kg)  10/28/21 (!) 320 lb 14.4 oz (145.6 kg)       Risk Assessment/Risk Calculators:                 ASSESSMENT & PLAN:    Paroxysmal supraventricular tachycardia Postpartum cardiovascular visit. Morbid obesity  Clinically she appears to be doing well from a cardiovascular standpoint.  She offers no complaints at this time.  Unfortunately unable to assess what her blood pressure is postpartum as the patient did not get her blood pressure cuff.  The patient is in agreement with the above plan. The patient left the office in stable condition.  The patient will follow up in 1 year.  Patient Instructions  Medication Instructions:  Your physician recommends that you continue on your current medications as directed. Please refer to the Current Medication list given to you today.  *If you need a refill on your cardiac medications before your next appointment, please call your pharmacy*   Lab Work: None If you have labs (blood work) drawn today and your tests are completely normal, you will receive your results only by: Ollie (if you have MyChart) OR A paper copy in the mail If you have any lab test that is abnormal or we need to change your treatment, we will call you to review the  results.   Testing/Procedures: None   Follow-Up: At [facility], you and your health needs are our priority.  As part of our continuing mission to provide you with exceptional heart care, we have created designated Provider Care Teams.  These Care Teams include your primary Cardiologist (physician) and Advanced Practice Providers (APPs -  Physician Assistants and Nurse Practitioners) who all work together to provide you with the care you need, when you need it.  We recommend signing up for the patient portal called "MyChart".  Sign up information is provided on this After Visit Summary.  MyChart is used to connect with patients for Virtual Visits (Telemedicine).  Patients are able to view lab/test results, encounter notes, upcoming appointments, etc.  Non-urgent messages can be sent to your provider as well.   To learn more about what you can do with MyChart, go to [email or web address].    Your next appointment:   1 year(s)  The format for your next appointment:   In Person  Provider:   Berniece Salines, DO     Other Instructions   Important Information About Sugar         Dispo:  Return in about 1 year (around 12/03/2022).   Medication Adjustments/Labs and Tests Ordered: Current medicines are reviewed at length with the patient today.  Concerns regarding medicines are outlined above.  Tests Ordered: No orders of the defined types were placed in this encounter.  Medication Changes: No orders of the defined types were placed in this encounter.

## 2021-12-02 NOTE — Patient Instructions (Signed)
Medication Instructions:  Your physician recommends that you continue on your current medications as directed. Please refer to the Current Medication list given to you today.  *If you need a refill on your cardiac medications before your next appointment, please call your pharmacy*   Lab Work: None If you have labs (blood work) drawn today and your tests are completely normal, you will receive your results only by: MyChart Message (if you have MyChart) OR A paper copy in the mail If you have any lab test that is abnormal or we need to change your treatment, we will call you to review the results.   Testing/Procedures: None   Follow-Up: At [facility], you and your health needs are our priority.  As part of our continuing mission to provide you with exceptional heart care, we have created designated Provider Care Teams.  These Care Teams include your primary Cardiologist (physician) and Advanced Practice Providers (APPs -  Physician Assistants and Nurse Practitioners) who all work together to provide you with the care you need, when you need it.  We recommend signing up for the patient portal called "MyChart".  Sign up information is provided on this After Visit Summary.  MyChart is used to connect with patients for Virtual Visits (Telemedicine).  Patients are able to view lab/test results, encounter notes, upcoming appointments, etc.  Non-urgent messages can be sent to your provider as well.   To learn more about what you can do with MyChart, go to [email or web address].    Your next appointment:   1 year(s)  The format for your next appointment:   In Person  Provider:   Kardie Tobb, DO     Other Instructions   Important Information About Sugar       

## 2021-12-03 ENCOUNTER — Encounter: Payer: Self-pay | Admitting: Cardiology

## 2021-12-03 ENCOUNTER — Ambulatory Visit (INDEPENDENT_AMBULATORY_CARE_PROVIDER_SITE_OTHER): Payer: Managed Care, Other (non HMO) | Admitting: Obstetrics and Gynecology

## 2021-12-03 ENCOUNTER — Encounter: Payer: Self-pay | Admitting: Obstetrics and Gynecology

## 2021-12-03 DIAGNOSIS — Z6841 Body Mass Index (BMI) 40.0 and over, adult: Secondary | ICD-10-CM

## 2021-12-03 MED ORDER — FUROSEMIDE 20 MG PO TABS: 20.0000 mg | ORAL_TABLET | ORAL | 0 refills | Status: DC

## 2021-12-03 NOTE — Progress Notes (Signed)
   OBSTETRICS POSTPARTUM CLINIC PROGRESS NOTE  Subjective:     Kristina Mccall is a 36 y.o. G53P1001 female who presents for a postpartum visit. She is 6 weeks postpartum following a low cervical transverse Cesarean section. I have fully reviewed the prenatal and intrapartum course. The delivery was at 38.2 gestational weeks.  Anesthesia: spinal. Postpartum course has been going well. Baby's course has been uncompicated. Baby is feeding by both breast and bottle - breast milk . Bleeding: patient has not not resumed menses, with No LMP recorded. Bowel function is normal. Bladder function is normal. Patient is not sexually active. Contraception method desired is IUD. Postpartum depression screening: negative.  EDPS score is 5.   Patient has questions regarding her incision. Notes that she she noticed that there is an area that is still slightly open. Is very small, but looks like it was trying to get bigger. Has been using Neosporin to the area and trying to keep it dry.  Also has questions about how frequently she should be checking on her infant. Wonders if she is just experiencing normal "new mom" nervousness or if she is truly experiencing anxiety.   The following portions of the patient's history were reviewed and updated as appropriate: allergies, current medications, past family history, past medical history, past social history, past surgical history, and problem list.  Review of Systems Pertinent items noted in HPI and remainder of comprehensive ROS otherwise negative.   Objective:    BP (!) 118/58   Pulse 77   Resp 16   Ht 5\' 3"  (1.6 m)   Wt (!) 305 lb 12.8 oz (138.7 kg)   Breastfeeding Yes   BMI 54.17 kg/m   General:  alert and no distress   Breasts:  inspection negative, no nipple discharge or bleeding, no masses or nodularity palpable  Lungs: clear to auscultation bilaterally  Heart:  regular rate and rhythm, S1, S2 normal, no murmur, click, rub or gallop  Abdomen: soft,  non-tender; bowel sounds normal; no masses,  no organomegaly. Pfannenstiel incision with 2 pinpoint areas of superficial wound separation on right 1/3 of incision.    Vulva:  normal  Vagina: normal vagina, no discharge, exudate, lesion, or erythema  Cervix:  no cervical motion tenderness and no lesions  Corpus: normal size, contour, position, consistency, mobility, non-tender  Adnexa:  normal adnexa and no mass, fullness, tenderness  Rectal Exam: Not performed.         Labs:  Lab Results  Component Value Date   HGB 11.1 (L) 10/22/2021     Assessment:   1. Postpartum care following cesarean delivery   2. Lactating mother   3. BMI 50.0-59.9, adult (HCC)      Plan:   1. Contraception: none.  2. Will check Hgb for h/o postpartum anemia of less than 10.  3. Reviewed wound care, cleaned and placed steri-strips on 2 small areas of superficial separation. Instructed on methods of keeping area clean and dry. Discussed cessation of use of Neosporin to the area.  4.  Reassured patient and discussed the difference between perinatal anxiety and normal "new mom" nervousness. Advised that if her thoughts and worries becoming consuming to the point of interfering with her daily life and functioning, she should return for further evaluation.  5. Follow up in:  6 months for annual, as needed.      10-24-1994, MD [facility]

## 2021-12-07 ENCOUNTER — Ambulatory Visit (INDEPENDENT_AMBULATORY_CARE_PROVIDER_SITE_OTHER): Payer: Managed Care, Other (non HMO) | Admitting: Obstetrics and Gynecology

## 2021-12-07 ENCOUNTER — Encounter: Payer: Self-pay | Admitting: Obstetrics and Gynecology

## 2021-12-07 VITALS — BP 132/84 | HR 75 | Ht 63.0 in | Wt 304.8 lb

## 2021-12-07 DIAGNOSIS — Z6841 Body Mass Index (BMI) 40.0 and over, adult: Secondary | ICD-10-CM | POA: Diagnosis not present

## 2021-12-07 DIAGNOSIS — T8131XD Disruption of external operation (surgical) wound, not elsewhere classified, subsequent encounter: Secondary | ICD-10-CM

## 2021-12-07 DIAGNOSIS — Z4889 Encounter for other specified surgical aftercare: Secondary | ICD-10-CM | POA: Diagnosis not present

## 2021-12-07 NOTE — Progress Notes (Signed)
Subjective:     Kristina Mccall is a 36 y.o. female who presents today for wound check. She is  ~ 7 weeks postpartum s/p LTCS. Reports that after her postpartum visit last week, she noticed that the steri-strips came off after ~ 2-3 days, and that the superficial skin separation increased from the size of a pen tip to ~ 1 inch.  Current symptoms: none.  Denies pain, oozing or bleeding from the incision. Interventions to date: dressing changed 4 days ago.   Objective:    BP 132/84   Pulse 75   Ht 5\' 3"  (1.6 m)   Wt (!) 304 lb 12.8 oz (138.3 kg)   Breastfeeding Yes   BMI 53.99 kg/m   Wound:   appears to have worsened since last recheck, superficial skin separation now ~ 1.5 cm, previously 2-3 mm.       Assessment:   1. Encounter for postoperative wound check   2. Superficial disruption or dehiscence of operation wound, subsequent encounter   3. BMI 50.0-59.9, adult (HCC)    Plan:    1.  Cares provided were visual inspection, cleansing with hydrogen peroxide solution, and placement of Dermabond.Discussed appropriate home care of this wound. 2. Patient instructions were given. 3. Follow up: as needed, if symptoms progress or fail to improve.

## 2021-12-07 NOTE — Telephone Encounter (Signed)
Spoke with patient. She will come into the office today.

## 2021-12-07 NOTE — Telephone Encounter (Signed)
See if she can come in around 3:30 or 4 today for me to take a look and possibly put some Dermabond on it.

## 2021-12-09 ENCOUNTER — Encounter: Payer: Self-pay | Admitting: Obstetrics and Gynecology

## 2021-12-10 ENCOUNTER — Ambulatory Visit (INDEPENDENT_AMBULATORY_CARE_PROVIDER_SITE_OTHER): Payer: Managed Care, Other (non HMO) | Admitting: Obstetrics and Gynecology

## 2021-12-10 ENCOUNTER — Encounter: Payer: Self-pay | Admitting: Obstetrics and Gynecology

## 2021-12-10 VITALS — BP 100/69 | HR 61 | Ht 63.0 in | Wt 302.6 lb

## 2021-12-10 DIAGNOSIS — T8131XD Disruption of external operation (surgical) wound, not elsewhere classified, subsequent encounter: Secondary | ICD-10-CM | POA: Diagnosis not present

## 2021-12-10 DIAGNOSIS — Z6841 Body Mass Index (BMI) 40.0 and over, adult: Secondary | ICD-10-CM | POA: Diagnosis not present

## 2021-12-10 DIAGNOSIS — Z5189 Encounter for other specified aftercare: Secondary | ICD-10-CM

## 2021-12-10 NOTE — Progress Notes (Unsigned)
    Subjective:     Kristina Mccall is a 36 y.o. G1P1001 morbidly obese female who presents today for wound check. She is 7 weeks s/p primary C-section, currently dealing with superficial wound separation. Reports that since she was seen last week, noted an area approximately 1 cm of skin separation near middle of incision.  Dermabond was placed over the area.  Patient reports that since then, she feels like the area has widened.  Noted a small amount of blood after wiping yesterday.  Current symptoms: none.  Objective:    BP 100/69   Pulse 61   Ht 5\' 3"  (1.6 m)   Wt (!) 302 lb 9.6 oz (137.3 kg)   Breastfeeding Unknown   BMI 53.60 kg/m   Wound:   appearance is similar to last recheck, superficial skin separation now ~ 1.5 cm     Assessment:    Wound check.  Superficial wound dehiscence. Morbid obesity  Plan:   1. Discussed appropriate home care of this wound.Cares provided were visual inspection cleansing with hydrogen peroxide solution application of topical medication Dermabond.  2. Patient instructions were given. 3. Follow up:  As needed, if symptoms persist or worsen .

## 2021-12-16 ENCOUNTER — Encounter: Payer: Self-pay | Admitting: Obstetrics and Gynecology

## 2021-12-16 ENCOUNTER — Telehealth: Payer: Self-pay | Admitting: Obstetrics and Gynecology

## 2021-12-16 ENCOUNTER — Ambulatory Visit (INDEPENDENT_AMBULATORY_CARE_PROVIDER_SITE_OTHER): Payer: Managed Care, Other (non HMO) | Admitting: Obstetrics and Gynecology

## 2021-12-16 VITALS — BP 117/51 | HR 80 | Resp 16 | Ht 63.0 in | Wt 304.1 lb

## 2021-12-16 DIAGNOSIS — Z4889 Encounter for other specified surgical aftercare: Secondary | ICD-10-CM

## 2021-12-16 NOTE — Progress Notes (Signed)
HPI:      [name] is a 36 y.o. G1P1001 who LMP was No LMP recorded.  Subjective:   She presents today stating that she is not sure if her wound is healing well.  She says she cannot see it and does not know.  She thinks the glue is not holding.  She says there is only a very small amount of discharge -it is not draining.  Denies fevers.  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 She does not have any pertinent problems on file. She  has a past surgical history that includes Colposcopy (2009/2010); Intrauterine device (iud) insertion (10/24/2013); and Cesarean section (N/A, 10/22/2021). Her family history includes Cancer in her mother; Diabetes in her maternal grandmother; Heart disease in her maternal grandfather; Hyperlipidemia in her father and mother; Hypertension in her mother. She  reports that she has never smoked. She has never used smokeless tobacco. She reports that she does not currently use alcohol. She reports that she does not use drugs. She has a current medication list which includes the following prescription(s): vitamin d3, docusate sodium, docusate sodium, furosemide, ibuprofen, nystatin-triamcinolone ointment, prenatal multivitamin, and zinc gluconate. She is allergic to amoxicillin, ciprofloxacin, doxycycline, hydrocodone, methylprednisolone sodium succ, other, penicillins, and latex.       Review of Systems:  Review of Systems  Constitutional: Denied constitutional symptoms, night sweats, recent illness, fatigue, fever,  insomnia and weight loss.  Eyes: Denied eye symptoms, eye pain, photophobia, vision change and visual disturbance.  Ears/Nose/Throat/Neck: Denied ear, nose, throat or neck symptoms, hearing loss, nasal discharge, sinus congestion and sore throat.  Cardiovascular: Denied cardiovascular symptoms, arrhythmia, chest pain/pressure, edema, exercise intolerance, orthopnea and palpitations.  Respiratory: Denied pulmonary symptoms, asthma, pleuritic pain, productive sputum, cough, dyspnea and wheezing.  Gastrointestinal: Denied, gastro-esophageal reflux, melena, nausea and vomiting.  Genitourinary: Denied genitourinary symptoms including symptomatic vaginal discharge, pelvic relaxation issues, and urinary complaints.  Musculoskeletal: Denied musculoskeletal symptoms, stiffness, swelling, muscle weakness and myalgia.  Dermatologic: See HPI for additional information.  Neurologic: Denied neurology symptoms, dizziness, headache, neck pain and syncope.  Psychiatric: Denied psychiatric symptoms, anxiety and depression.  Endocrine: Denied endocrine symptoms including hot flashes and night sweats.   Meds:   Current Outpatient Medications on File Prior to Visit  Medication Sig Dispense Refill   Cholecalciferol (VITAMIN D3) 125 MCG (5000 UT) CAPS Take by mouth.     docusate sodium (COLACE) 100 MG capsule Take 1 capsule (100 mg total) by mouth 2 (two) times daily as needed. 30 capsule 2   docusate sodium (COLACE) 100 MG capsule Take 1 capsule (100 mg total) by mouth 2 (two) times daily as needed. 30 capsule 2   furosemide (LASIX) 20 MG tablet Take 1 tablet (20 mg total) by mouth every other day. 5 tablet 0   ibuprofen (ADVIL) 600 MG tablet Take 1 tablet (600 mg total) by mouth every 6 (six) hours. 60 tablet 1   nystatin-triamcinolone ointment (MYCOLOG) Apply 1 application. topically 2 (two) times daily. 30 g 0   Prenatal Vit-Fe Fumarate-FA (PRENATAL MULTIVITAMIN) TABS tablet Take  1 tablet by mouth daily at 12  noon.     zinc gluconate 50 MG tablet Take 50 mg by mouth daily.     No current facility-administered medications on file prior to visit.      Objective:     Vitals:   12/16/21 1510  BP: (!) 117/51  Pulse: 80  Resp: 16   Filed Weights   12/16/21 1510  Weight: (!) 304 lb 1.6 oz (137.9 kg)              Examination of her abdominal wound reveals a wound that is healing well.  There is no drainage.  There is no defect except for a very shallow skin defect.          Assessment:    G1P1001 Patient Active Problem List   Diagnosis Date Noted   Morbid obesity (HCC) 12/02/2021   Breech presentation, no version 10/16/2021   Health education/counseling 10/07/2021   PSVT (paroxysmal supraventricular tachycardia) (HCC) 08/09/2021   BMI 50.0-59.9, adult (HCC) 04/27/2021   History of cervical dysplasia 08/18/2020   Decreased libido 05/29/2018   Anxiety and depression 05/29/2018     1. Encounter for postoperative wound care     Wound is healing very well.  The glue has separated but there is no longer any defect.  Expect complete healing within 1 week.   Plan:            1.  Patient advised on continued wound care.  Keep area dry during the day.  Cleanse twice daily with liquid soap. Orders No orders of the defined types were placed in this encounter.   No orders of the defined types were placed in this encounter.     F/U  No follow-ups on file. I spent 18 minutes involved in the care of this patient preparing to see the patient by obtaining and reviewing her medical history (including labs, imaging tests and prior procedures), documenting clinical information in the electronic health record (EHR), counseling and coordinating care plans, writing and sending prescriptions, ordering tests or procedures and in direct communicating with the patient and medical staff discussing pertinent items from her history and physical exam.  Elonda Husky, M.D. 12/16/2021 3:30 PM

## 2021-12-16 NOTE — Telephone Encounter (Signed)
Pt called requesting to see Dr. Logan Bores this about her c-section insicion site. Pt states that it doe snot look right- denies signs of infection. Pt states that it had reopened and Dr.Cherry had glued it together but she does not think it held. Please advise.

## 2021-12-22 ENCOUNTER — Encounter: Payer: Self-pay | Admitting: Obstetrics and Gynecology

## 2021-12-26 HISTORY — PX: INTRAUTERINE DEVICE (IUD) INSERTION: SHX5877

## 2022-01-01 ENCOUNTER — Encounter: Payer: Self-pay | Admitting: Emergency Medicine

## 2022-01-01 ENCOUNTER — Emergency Department: Payer: Managed Care, Other (non HMO)

## 2022-01-01 ENCOUNTER — Emergency Department
Admission: EM | Admit: 2022-01-01 | Discharge: 2022-01-01 | Discharge status: Against Medical Advice | Payer: Managed Care, Other (non HMO) | Attending: Emergency Medicine | Admitting: Emergency Medicine

## 2022-01-01 DIAGNOSIS — M546 Pain in thoracic spine: Secondary | ICD-10-CM | POA: Insufficient documentation

## 2022-01-01 DIAGNOSIS — Z5321 Procedure and treatment not carried out due to patient leaving prior to being seen by health care provider: Secondary | ICD-10-CM | POA: Insufficient documentation

## 2022-01-01 DIAGNOSIS — R1013 Epigastric pain: Secondary | ICD-10-CM | POA: Insufficient documentation

## 2022-01-01 LAB — CBC
HCT: 40.1 % (ref 36.0–46.0)
Hemoglobin: 12.7 g/dL (ref 12.0–15.0)
MCH: 28.3 pg (ref 26.0–34.0)
MCHC: 31.7 g/dL (ref 30.0–36.0)
MCV: 89.5 fL (ref 80.0–100.0)
Platelets: 327 10*3/uL (ref 150–400)
RBC: 4.48 MIL/uL (ref 3.87–5.11)
RDW: 13.3 % (ref 11.5–15.5)
WBC: 12.8 10*3/uL — ABNORMAL HIGH (ref 4.0–10.5)
nRBC: 0 % (ref 0.0–0.2)

## 2022-01-01 LAB — BASIC METABOLIC PANEL
Anion gap: 10 (ref 5–15)
BUN: 18 mg/dL (ref 6–20)
CO2: 24 mmol/L (ref 22–32)
Calcium: 9.1 mg/dL (ref 8.9–10.3)
Chloride: 105 mmol/L (ref 98–111)
Creatinine, Ser: 0.76 mg/dL (ref 0.44–1.00)
GFR, Estimated: >60 mL/min (ref >60)
Glucose, Bld: 123 mg/dL — ABNORMAL HIGH (ref 70–99)
Potassium: 3.5 mmol/L (ref 3.5–5.1)
Sodium: 139 mmol/L (ref 135–145)

## 2022-01-01 LAB — HEPATIC FUNCTION PANEL
ALT: 147 U/L — ABNORMAL HIGH (ref 0–44)
AST: 200 U/L — ABNORMAL HIGH (ref 15–41)
Albumin: 4 g/dL (ref 3.5–5.0)
Alkaline Phosphatase: 131 U/L — ABNORMAL HIGH (ref 38–126)
Bilirubin, Direct: 0.3 mg/dL — ABNORMAL HIGH (ref 0.0–0.2)
Indirect Bilirubin: 0.7 mg/dL (ref 0.3–0.9)
Total Bilirubin: 1 mg/dL (ref 0.3–1.2)
Total Protein: 7.7 g/dL (ref 6.5–8.1)

## 2022-01-01 LAB — TROPONIN I (HIGH SENSITIVITY)
Troponin I (High Sensitivity): 3 ng/L (ref <18)
Troponin I (High Sensitivity): 4 ng/L (ref <18)

## 2022-01-01 LAB — HCG, QUANTITATIVE, PREGNANCY: hCG, Beta Chain, Quant, S: <1 m[IU]/mL (ref <5)

## 2022-01-01 LAB — LIPASE, BLOOD: Lipase: 30 U/L (ref 11–51)

## 2022-01-01 NOTE — ED Notes (Signed)
Pt reports she needs to go home to his 77 week old baby, agreed for blood work and VS

## 2022-01-01 NOTE — ED Triage Notes (Signed)
Pt reports upper back pain and epigastric pain that started about 5 pm today. Denies SOB. Also had some nausea. Pt 10 weeks post partum, denies any complications.

## 2022-01-01 NOTE — ED Provider Triage Note (Signed)
Emergency Medicine Provider Triage Evaluation Note  Kristina Mccall , a 36 y.o. female  was evaluated in triage.  Pt complains of sudden onset of upper back and epigastric pain.  Patient describes symptoms began about 5 PM this afternoon.  She denies any associated shortness of breath but reports some nausea.  She describes that the symptoms are aggravated by deep breaths and movement of the upper extremities.  Patient denies any history of pancreatitis, gallstones, or gastritis.  Patient is 10 weeks postpartum from a C-section delivery without complications.  Review of Systems  Positive: Epigastric/upper back pain Negative: SOB, cough, vomtiing  Physical Exam  BP 132/61   Pulse 77   Temp 98.6 F (37 C) (Oral)   Resp 16   Ht 5\' 3"  (1.6 m)   Wt 136.1 kg   SpO2 98%   Breastfeeding Yes   BMI 53.14 kg/m  Gen:   Awake, no distress  NAD Resp:  Normal effort CTA MSK:   Moves extremities without difficulty  ABD:  Soft, nontender  Medical Decision Making  Medically screening exam initiated at 7:02 PM.  Appropriate orders placed.  was informed that the remainder of the evaluation will be completed by another provider, this initial triage assessment does not replace that evaluation, and the importance of remaining in the ED until their evaluation is complete.  Patient 10 weeks status post c-section with complaints of some epigastric and upper back pain.  She denies any diaphoresis or vomiting, cough, or hemoptysis.   Elvis Coil, PA-C 01/01/22 1907

## 2022-01-01 NOTE — ED Notes (Signed)
Pt agreeable to repeat labs but declines to be seen at this time due to having a newborn at home. No distress noted. Pt declined repeat VS.

## 2022-01-02 ENCOUNTER — Telehealth: Payer: Self-pay | Admitting: Emergency Medicine

## 2022-01-02 NOTE — Telephone Encounter (Signed)
Pt called back about her lab results. Pt instructed that she needed to follow up with her PCP or come back to the ED to be evaluated due to her LFTs being elevated. Pt states that she will follow up with her OB as she does not have a PCP. Pt instructed to return to the ED if she gets worse. Pt verbalized understanding.

## 2022-01-04 NOTE — Progress Notes (Unsigned)
GYNECOLOGY PROGRESS NOTE  Subjective:    Patient ID: Kristina Mccall, female    DOB: 07-17-1985, 36 y.o.   MRN: [id number]  HPI Patient is a 36 y.o. G52P1001 female who presents for hospital follow up. She was evaluated in the ED on 01/01/2022. She was experiencing upper back and epigastric pain. Also with associated nausea. She did not stay for a complete evaluation as she had to go home to her 68 week old infant. She did agree to have her blood work and vital signs done while there. Her liver enzymes were elevated and was told to follow up in short time frame. Patient does not have a PCP, is working to establish (has appt in August), but decided to be seen with OB/GYN until then. Denies prior history of gallstones (but does note family in mother). Also declines recent Tylenol use.   She also is having some ligament pain, and wants to know what to do to relieve the pain. She reports that her c-section scar is finally healed.   The following portions of the patient's history were reviewed and updated as appropriate: allergies, current medications, past family history, past medical history, past social history, past surgical history, and problem list.  Review of Systems Pertinent items noted in HPI and remainder of comprehensive ROS otherwise negative.   Objective:   Blood pressure (!) 129/57, pulse 73, resp. rate 16, height 5\' 3"  (1.6 m), weight (!) 304 lb 1.6 oz (137.9 kg), currently breastfeeding.  Body mass index is 53.87 kg/m. General appearance: alert, cooperative, and no distress Remainder of exam  deferred (patient currently breastfeeding infant during today's visit).     Labs:  Lab Results  Component Value Date   WBC 12.8 (H) 01/01/2022   HGB 12.7 01/01/2022   HCT 40.1 01/01/2022   MCV 89.5 01/01/2022   PLT 327 01/01/2022      Chemistry      Component Value Date/Time   NA 139 01/01/2022 1843   NA 142 12/15/2018 0857   K 3.5 01/01/2022 1843   CL 105 01/01/2022 1843    CO2 24 01/01/2022 1843   BUN 18 01/01/2022 1843   BUN 7 12/15/2018 0857   CREATININE 0.76 01/01/2022 1843      Component Value Date/Time   CALCIUM 9.1 01/01/2022 1843   ALKPHOS 131 (H) 01/01/2022 2157   AST 200 (H) 01/01/2022 2157   ALT 147 (H) 01/01/2022 2157   BILITOT 1.0 01/01/2022 2157   BILITOT 0.6 12/15/2018 0857      Assessment:   1. Upper abdominal pain, unspecified   2. Elevated liver enzymes   3. Pain of pelvic girdle      Plan:   1. Upper abdominal pain, unspecified - Unclear cause, but with elevated liver enzymes differential dx includes gallstones, fatty liver, etc. No active gallbladder infection noted.  - 12/17/2018 Abdomen Complete; Future  2. Elevated liver enzymes - See above for upper abdominal pain. No other external risks factors (medications, trauma, etc).  - US Abdomen Complete; Future  3. Pain of pelvic girdle - Discussed options for referrals to PT, chiropractor, or yoga. Given info on options. Also discussed certain stretches/exercises that could be completed at home.    A total of 22 minutes were spent during this encounter, including review of previous povider notes, recent imaging and labs, face-to-face with time with patient involving counseling and coordination of care, as well as documentation for current visit.    [place], MD [facility]

## 2022-01-05 ENCOUNTER — Ambulatory Visit: Payer: Managed Care, Other (non HMO) | Admitting: Obstetrics and Gynecology

## 2022-01-05 ENCOUNTER — Encounter: Payer: Self-pay | Admitting: Obstetrics and Gynecology

## 2022-01-05 VITALS — BP 129/57 | HR 73 | Resp 16 | Ht 63.0 in | Wt 304.1 lb

## 2022-01-05 DIAGNOSIS — R1010 Upper abdominal pain, unspecified: Secondary | ICD-10-CM

## 2022-01-05 DIAGNOSIS — R748 Abnormal levels of other serum enzymes: Secondary | ICD-10-CM | POA: Diagnosis not present

## 2022-01-05 DIAGNOSIS — R102 Pelvic and perineal pain: Secondary | ICD-10-CM | POA: Diagnosis not present

## 2022-01-13 ENCOUNTER — Other Ambulatory Visit: Payer: Managed Care, Other (non HMO)

## 2022-01-14 ENCOUNTER — Encounter: Payer: Managed Care, Other (non HMO) | Admitting: Obstetrics and Gynecology

## 2022-01-14 ENCOUNTER — Emergency Department
Admission: EM | Admit: 2022-01-14 | Discharge: 2022-01-15 | Disposition: A | Discharge status: Home / Self Care | Payer: Managed Care, Other (non HMO) | Attending: Emergency Medicine | Admitting: Emergency Medicine

## 2022-01-14 ENCOUNTER — Encounter: Payer: Self-pay | Admitting: Emergency Medicine

## 2022-01-14 ENCOUNTER — Emergency Department: Payer: Managed Care, Other (non HMO)

## 2022-01-14 DIAGNOSIS — K8020 Calculus of gallbladder without cholecystitis without obstruction: Secondary | ICD-10-CM | POA: Diagnosis not present

## 2022-01-14 DIAGNOSIS — R7989 Other specified abnormal findings of blood chemistry: Secondary | ICD-10-CM

## 2022-01-14 DIAGNOSIS — R1011 Right upper quadrant pain: Secondary | ICD-10-CM | POA: Diagnosis present

## 2022-01-14 DIAGNOSIS — D72829 Elevated white blood cell count, unspecified: Secondary | ICD-10-CM | POA: Diagnosis not present

## 2022-01-14 LAB — CBC WITH DIFFERENTIAL/PLATELET
Abs Immature Granulocytes: 0.05 10*3/uL (ref 0.00–0.07)
Basophils Absolute: 0 10*3/uL (ref 0.0–0.1)
Basophils Relative: 0 %
Eosinophils Absolute: 0.3 10*3/uL (ref 0.0–0.5)
Eosinophils Relative: 3 %
HCT: 39.4 % (ref 36.0–46.0)
Hemoglobin: 12.5 g/dL (ref 12.0–15.0)
Immature Granulocytes: 0 %
Lymphocytes Relative: 14 %
Lymphs Abs: 1.6 10*3/uL (ref 0.7–4.0)
MCH: 28.7 pg (ref 26.0–34.0)
MCHC: 31.7 g/dL (ref 30.0–36.0)
MCV: 90.4 fL (ref 80.0–100.0)
Monocytes Absolute: 0.9 10*3/uL (ref 0.1–1.0)
Monocytes Relative: 8 %
Neutro Abs: 8.7 10*3/uL — ABNORMAL HIGH (ref 1.7–7.7)
Neutrophils Relative %: 75 %
Platelets: 318 10*3/uL (ref 150–400)
RBC: 4.36 MIL/uL (ref 3.87–5.11)
RDW: 13.7 % (ref 11.5–15.5)
WBC: 11.6 10*3/uL — ABNORMAL HIGH (ref 4.0–10.5)
nRBC: 0 % (ref 0.0–0.2)

## 2022-01-14 LAB — URINALYSIS, ROUTINE W REFLEX MICROSCOPIC
Bilirubin Urine: NEGATIVE
Glucose, UA: NEGATIVE mg/dL
Hgb urine dipstick: NEGATIVE
Ketones, ur: NEGATIVE mg/dL
Leukocytes,Ua: NEGATIVE
Nitrite: NEGATIVE
Protein, ur: 30 mg/dL — AB
Specific Gravity, Urine: 1.029 (ref 1.005–1.030)
pH: 6 (ref 5.0–8.0)

## 2022-01-14 LAB — COMPREHENSIVE METABOLIC PANEL
ALT: 121 U/L — ABNORMAL HIGH (ref 0–44)
AST: 151 U/L — ABNORMAL HIGH (ref 15–41)
Albumin: 3.9 g/dL (ref 3.5–5.0)
Alkaline Phosphatase: 174 U/L — ABNORMAL HIGH (ref 38–126)
Anion gap: 8 (ref 5–15)
BUN: 13 mg/dL (ref 6–20)
CO2: 27 mmol/L (ref 22–32)
Calcium: 9.2 mg/dL (ref 8.9–10.3)
Chloride: 107 mmol/L (ref 98–111)
Creatinine, Ser: 0.73 mg/dL (ref 0.44–1.00)
GFR, Estimated: >60 mL/min (ref >60)
Glucose, Bld: 114 mg/dL — ABNORMAL HIGH (ref 70–99)
Potassium: 3.7 mmol/L (ref 3.5–5.1)
Sodium: 142 mmol/L (ref 135–145)
Total Bilirubin: 1.3 mg/dL — ABNORMAL HIGH (ref 0.3–1.2)
Total Protein: 7.6 g/dL (ref 6.5–8.1)

## 2022-01-14 LAB — TROPONIN I (HIGH SENSITIVITY)
Troponin I (High Sensitivity): 4 ng/L (ref <18)
Troponin I (High Sensitivity): 3 ng/L (ref <18)

## 2022-01-14 LAB — LIPASE, BLOOD: Lipase: 29 U/L (ref 11–51)

## 2022-01-14 MED ORDER — ONDANSETRON 4 MG PO TBDP
4.0000 mg | ORAL_TABLET | Freq: Once | ORAL | Status: AC
  Administered 2022-01-14: 4 mg via ORAL
  Filled 2022-01-14: qty 1

## 2022-01-14 NOTE — ED Provider Notes (Signed)
[facility] Provider Note    Event Date/Time   First MD Initiated Contact with Patient 01/14/22 2303     (approximate)   History   Abdominal Pain   HPI  Kristina Mccall is a 36 y.o. female with no significant past medical history who presents to the emergency department with complaints of right upper quadrant abdominal pain worse after eating that has been ongoing intermittently for several weeks.  Pain was worse tonight and did not seem to resolve like it normally did which prompted her to come to the ER.  Has had nausea.  No vomiting, diarrhea.  No fever.  No chest pain or shortness of breath but pain is worse with deep inspiration.  She did just have a baby about 3 months ago by C-section.  No dysuria, hematuria, abnormal vaginal bleeding or discharge.  She is currently breast-feeding.  She was seen in the emergency department recently for the same and found to have elevated liver function test but had to leave to go breast-feed her child prior to being seen.  She has not yet had an ultrasound of her gallbladder.  States she is not drinking alcohol.  Does not take Tylenol regularly.   History provided by patient and family.    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; 02/11/2010;11/27/2012; 12/15/2012   positive high risk   LGSIL on Pap smear of cervix 03/18/2009   lgsil/hpv present    Past Surgical History:  Procedure Laterality Date   CESAREAN SECTION N/A 10/22/2021   Procedure: CESAREAN SECTION PRIMARY LOW-TRANSVERSE;  Surgeon: Hildred Laser, MD;  Location: [facility] ORS;  Service: Obstetrics;  Laterality: N/A;   COLPOSCOPY  2009/2010   no bx done   INTRAUTERINE DEVICE (IUD)  INSERTION  10/24/2013    MEDICATIONS:  Prior to Admission medications   Medication Sig Start Date End Date Taking? Authorizing Provider  Cholecalciferol (VITAMIN D3) 125 MCG (5000 UT) CAPS Take by mouth.    [provider]  docusate sodium (COLACE) 100 MG capsule Take 1 capsule (100 mg total) by mouth 2 (two) times daily as needed. 10/23/21   Hildred Laser, MD  docusate sodium (COLACE) 100 MG capsule Take 1 capsule (100 mg total) by mouth 2 (two) times daily as needed. 10/23/21   Hildred Laser, MD  furosemide (LASIX) 20 MG tablet Take 1 tablet (20 mg total) by mouth every other day. 12/03/21   Hildred Laser, MD  ibuprofen (ADVIL) 600 MG tablet Take 1 tablet (600 mg total) by mouth every 6 (six) hours. 10/23/21   Hildred Laser, MD  nystatin-triamcinolone ointment [facility]) Apply 1 application. topically 2 (two) times daily. 11/13/21   Hildred Laser, MD  Prenatal Vit-Fe Fumarate-FA (PRENATAL MULTIVITAMIN) TABS tablet Take 1 tablet by mouth daily at 12 noon.    [provider]  zinc gluconate 50 MG tablet Take 50 mg by mouth daily.    [provider]    Physical Exam   Triage Vital Signs: ED Triage Vitals  Enc Vitals Group     BP 01/14/22 1945 120/68     Pulse Rate 01/14/22 1945 66     Resp 01/14/22 1945 18  Temp 01/14/22 1945 98.3 F (36.8 C)     Temp Source 01/14/22 1945 Oral     SpO2 01/14/22 1945 97 %     Weight 01/14/22 1943 300 lb (136.1 kg)     Height 01/14/22 1943 5\' 3"  (1.6 m)     Head Circumference --      Peak Flow --      Pain Score 01/14/22 1943 3     Pain Loc --      Pain Edu? --      Excl. in GC? --     Most recent vital signs: Vitals:   01/14/22 2344 01/15/22 0017  BP:  (!) 99/50  Pulse:  (!) 50  Resp:  18  Temp: 98.3 F (36.8 C)   SpO2:  97%    CONSTITUTIONAL: Alert and oriented and responds appropriately to questions. Well-appearing; well-nourished HEAD: Normocephalic, atraumatic EYES: Conjunctivae clear, pupils appear equal,  sclera nonicteric ENT: normal nose; moist mucous membranes NECK: Supple, normal ROM CARD: RRR; S1 and S2 appreciated; no murmurs, no clicks, no rubs, no gallops RESP: Normal chest excursion without splinting or tachypnea; breath sounds clear and equal bilaterally; no wheezes, no rhonchi, no rales, no hypoxia or respiratory distress, speaking full sentences ABD/GI: Normal bowel sounds; non-distended; soft, mildly tender in the right upper quadrant with negative Murphy sign.  No guarding or rebound.  No tenderness at McBurney's point. BACK: The back appears normal EXT: Normal ROM in all joints; no deformity noted, no edema; no cyanosis SKIN: Normal color for age and race; warm; no rash on exposed skin NEURO: Moves all extremities equally, normal speech PSYCH: The patient's mood and manner are appropriate.   ED Results / Procedures / Treatments   LABS: (all labs ordered are listed, but only abnormal results are displayed) Labs Reviewed  CBC WITH DIFFERENTIAL/PLATELET - Abnormal; Notable for the following components:      Result Value   WBC 11.6 (*)    Neutro Abs 8.7 (*)    All other components within normal limits  COMPREHENSIVE METABOLIC PANEL - Abnormal; Notable for the following components:   Glucose, Bld 114 (*)    AST 151 (*)    ALT 121 (*)    Alkaline Phosphatase 174 (*)    Total Bilirubin 1.3 (*)    All other components within normal limits  URINALYSIS, ROUTINE W REFLEX MICROSCOPIC - Abnormal; Notable for the following components:   Color, Urine AMBER (*)    APPearance HAZY (*)    Protein, ur 30 (*)    Bacteria, UA RARE (*)    All other components within normal limits  LIPASE, BLOOD  TROPONIN I (HIGH SENSITIVITY)  TROPONIN I (HIGH SENSITIVITY)     EKG:  EKG Interpretation  Date/Time:  Thursday January 14 2022 19:56:28 EDT Ventricular Rate:  69 PR Interval:  164 QRS Duration: 96 QT Interval:  412 QTC Calculation: 441 R Axis:   64 Text Interpretation: Normal sinus  rhythm Normal ECG When compared with ECG of 01-Jan-2022 18:36, No significant change was found Confirmed by 03-Jan-2022 [phone]) on 01/14/2022 11:14:04 PM         RADIOLOGY: My personal review and interpretation of imaging: Gallstones.  I have personally reviewed all radiology reports.   01/16/2022 Abdomen Limited RUQ (LIVER/GB)  Result Date: 01/15/2022 CLINICAL DATA:  Abdominal pain. EXAM: ULTRASOUND ABDOMEN LIMITED RIGHT UPPER QUADRANT COMPARISON:  None Available. FINDINGS: Gallbladder: Physiologically distended, multiple layering gallstones. No gallbladder wall thickening. No pericholecystic fluid. No  sonographic Eulah Pont sign noted by sonographer. Common bile duct: Diameter: 2-3 mm, normal Liver: No focal lesion identified. Within normal limits in parenchymal echogenicity. Portal vein is patent on color Doppler imaging with normal direction of blood flow towards the liver. Other: No right upper quadrant ascites. IMPRESSION: 1. Gallstones without sonographic findings of acute cholecystitis. 2. No biliary dilatation. 3. Normal sonographic appearance of the liver. Electronically Signed   By: Narda Rutherford M.D.   On: 01/15/2022 00:36     PROCEDURES:  Critical Care performed: No     Procedures    IMPRESSION / MDM / ASSESSMENT AND PLAN / ED COURSE  I reviewed the triage vital signs and the nursing notes.    Patient here with complaints of right upper quad abdominal pain and recent elevation of her liver function test.  The patient is on the cardiac monitor to evaluate for evidence of arrhythmia and/or significant heart rate changes.   DIFFERENTIAL DIAGNOSIS (includes but not limited to):   Cholelithiasis, cholecystitis, choledocholithiasis, cholangitis, pancreatitis, gastritis, less likely PE or ACS, doubt appendicitis   Patient's presentation is most consistent with acute presentation with potential threat to life or bodily function.   PLAN: We will obtain CBC, CMP, lipase, right  upper quadrant ultrasound.  EKG is nonischemic.  Patient declines any pain medication.  She received nausea medicine in triage.  We will keep her n.p.o. at this time.   MEDICATIONS GIVEN IN ED: Medications  ondansetron (ZOFRAN-ODT) disintegrating tablet 4 mg (4 mg Oral Given 01/14/22 1950)     ED COURSE: Patient's labs show a slight leukocytosis.  Minimal elevation of her LFTs with mild elevation of total bilirubin at 1.3.  This appears stable compared to previous labs 2 weeks ago.  Lipase is normal.  Ultrasound reviewed/interpreted by myself and radiology and shows cholelithiasis without cholecystitis or choledocholithiasis.  She states she is feeling better and has declined any pain medicine here.  She would prefer outpatient follow-up as needed and would like to try diet changes prior to surgical intervention.  Will discharge with prescription of pain and nausea medicine and give outpatient surgery follow-up information.  Discussed at length return precautions.  She will avoid Tylenol, alcohol and we will have her get her LFTs rechecked by her PCP or OB/GYN in the next 2 weeks.   At this time, I do not feel there is any life-threatening condition present. I reviewed all nursing notes, vitals, pertinent previous records.  All lab and urine results, EKGs, imaging ordered have been independently reviewed and interpreted by myself.  I reviewed all available radiology reports from any imaging ordered this visit.  Based on my assessment, I feel the patient is safe to be discharged home without further emergent workup and can continue workup as an outpatient as needed. Discussed all findings, treatment plan as well as usual and customary return precautions.  They verbalize understanding and are comfortable with this plan.  Outpatient follow-up has been provided as needed.  All questions have been answered.    CONSULTS: Admission considered but patient reports feeling better would prefer discharge home  with further outpatient management.   OUTSIDE RECORDS REVIEWED: Reviewed patient's last office visit with Dr. Valentino Saxon with OB/GYN on 01/05/2022.       FINAL CLINICAL IMPRESSION(S) / ED DIAGNOSES   Final diagnoses:  Elevated liver function tests  Gallstones     Rx / DC Orders   ED Discharge Orders          Ordered  oxycodone (OXY-IR) 5 MG capsule  Every 4 hours PRN        01/15/22 0101    ondansetron (ZOFRAN-ODT) 4 MG disintegrating tablet  Every 6 hours PRN        01/15/22 0101             Note:  This document was prepared using Dragon voice recognition software and may include unintentional dictation errors.   Ramani Riva, Layla Maw, DO 01/15/22 [phone]

## 2022-01-14 NOTE — ED Triage Notes (Signed)
Pt presents via POV with complaints of epigastric pain with radiation to her back that started 2 weeks ago and has progressively gotten worse. Pt was seen here 2 weeks ago for same but left AMA. Pt states she was called after her last visit and was informed that she had elevated liver enzymes and to follow up here for imaging. Denies CP or SOB.

## 2022-01-15 ENCOUNTER — Encounter: Payer: Managed Care, Other (non HMO) | Admitting: Obstetrics and Gynecology

## 2022-01-15 ENCOUNTER — Encounter: Payer: Self-pay | Admitting: Obstetrics and Gynecology

## 2022-01-15 MED ORDER — OXYCODONE HCL 5 MG PO CAPS: 5.0000 mg | ORAL_CAPSULE | ORAL | 0 refills | Status: DC | PRN

## 2022-01-15 MED ORDER — ONDANSETRON 4 MG PO TBDP: 4.0000 mg | ORAL_TABLET | Freq: Four times a day (QID) | ORAL | 0 refills | Status: DC | PRN

## 2022-01-15 NOTE — Discharge Instructions (Signed)
I recommend close follow-up with your OB/GYN in 1 to 2 weeks to have your liver functions rechecked.  Please avoid Tylenol and alcohol.   You are being provided a prescription for opiates (also known as narcotics) for pain control.  Opiates can be addictive and should only be used when absolutely necessary for pain control when other alternatives do not work.  We recommend you only use them for the recommended amount of time and only as prescribed.  Please do not take with other sedative medications or alcohol.  Please do not drive, operate machinery, make important decisions while taking opiates.  Please note that these medications can be addictive and have high abuse potential.  Patients can become addicted to narcotics after only taking them for a few days.  Please keep these medications locked away from children, teenagers or any family members with history of substance abuse.  Narcotic pain medicine may also make you constipated.  You may use over-the-counter medications such as MiraLAX, Colace to prevent constipation.  If you become constipated, you may use over-the-counter enemas as needed.  Itching and nausea are also common side effects of narcotic pain medication.  If you develop uncontrolled vomiting or a rash, please stop these medications and seek medical care.

## 2022-01-18 ENCOUNTER — Ambulatory Visit: Payer: Managed Care, Other (non HMO)

## 2022-01-21 ENCOUNTER — Encounter: Payer: Self-pay | Admitting: Obstetrics and Gynecology

## 2022-01-21 ENCOUNTER — Ambulatory Visit: Payer: Managed Care, Other (non HMO) | Admitting: Obstetrics and Gynecology

## 2022-01-21 VITALS — BP 108/53 | HR 78 | Resp 16 | Ht 63.0 in | Wt 303.5 lb

## 2022-01-21 DIAGNOSIS — Z30430 Encounter for insertion of intrauterine contraceptive device: Secondary | ICD-10-CM | POA: Diagnosis not present

## 2022-01-21 MED ORDER — LEVONORGESTREL 20 MCG/DAY IU IUD
1.0000 | INTRAUTERINE_SYSTEM | Freq: Once | INTRAUTERINE | Status: AC
  Administered 2022-01-21: 1 via INTRAUTERINE

## 2022-01-21 NOTE — Patient Instructions (Signed)

## 2022-01-21 NOTE — Progress Notes (Signed)
     GYNECOLOGY OFFICE PROCEDURE NOTE  Kristina Mccall is a 36 y.o. G1P1001 here for Mirena IUD insertion. No GYN concerns.  Last pap smear was on 08/18/2020 and was normal.  Of note, patient reports being seen in the ER last week due to another episode of epigastric pain. Had an ultrasound that confirmed gallstones.  Plans to see General Surgeon in 2 weeks.   IUD Insertion Procedure Note Patient identified, informed consent performed, consent signed.   Discussed risks of irregular bleeding, cramping, infection, malpositioning or misplacement of the IUD outside the uterus which may require further procedure such as laparoscopy. Also discussed >99% contraception efficacy, increased risk of ectopic pregnancy with failure of method.   Emphasized that this did not protect against STIs, condoms recommended during all sexual encounters. Time out was performed.  Urine pregnancy test negative.  Speculum placed in the vagina.  Cervix visualized.  Cleaned with Betadine x 2.  Grasped anteriorly with a single tooth tenaculum.  Uterus sounded to 8 cm.  Mirena IUD placed per manufacturer's recommendations.  Strings trimmed to 3 cm. Tenaculum was removed, good hemostasis noted.  Patient tolerated procedure well.   Patient was given post-procedure instructions.  She was advised to have backup contraception for one week.  Patient was also asked to check IUD strings periodically and follow up in 4 weeks for IUD check.   Exp: 01/2024 Lot: Leane Platt, MD [facility]

## 2022-01-26 HISTORY — PX: CHOLECYSTECTOMY: SHX55

## 2022-02-04 ENCOUNTER — Ambulatory Visit: Payer: Self-pay | Admitting: Surgery

## 2022-02-04 ENCOUNTER — Telehealth: Payer: Self-pay | Admitting: Surgery

## 2022-02-04 ENCOUNTER — Encounter: Payer: Self-pay | Admitting: Surgery

## 2022-02-04 ENCOUNTER — Ambulatory Visit: Payer: Managed Care, Other (non HMO) | Admitting: Surgery

## 2022-02-04 VITALS — BP 122/83 | HR 94 | Temp 98.1°F | Ht 62.0 in | Wt 300.8 lb

## 2022-02-04 DIAGNOSIS — K8010 Calculus of gallbladder with chronic cholecystitis without obstruction: Secondary | ICD-10-CM | POA: Diagnosis not present

## 2022-02-04 NOTE — H&P (View-Only) (Signed)
Patient ID: Kristina Mccall, female   DOB: 07/24/1985, 36 y.o.   MRN: [id number]  Chief Complaint: Calculus cholecystitis  History of Present Illness Kristina Mccall is a 36 y.o. female with bandlike right upper quadrant to back pain postprandial.  She reports associated bloating and loose stools.   She denies nausea, vomiting, fevers or chills.  Her most severe attacks seem to be preceded by [employer] fries.  She reports her husband also likes to cook with butter and exacerbates her issues as well.  She is currently taking Zofran and oxycodone, while trying to nurse her infant girl.  She reports the pain last 3 to 4 hours at times, with the last attack being this last Sunday.  Uncertain what the provocation was at that time.  She denies any history of jaundice or acholic stools.  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; 02/11/2010;11/27/2012; 12/15/2012   positive high risk   LGSIL on Pap smear of cervix 03/18/2009   lgsil/hpv present      Past Surgical History:  Procedure Laterality Date   CESAREAN SECTION N/A 10/22/2021   Procedure: CESAREAN SECTION PRIMARY LOW-TRANSVERSE;  Surgeon: Hildred Laser, MD;  Location: [facility] ORS;  Service: Obstetrics;  Laterality: N/A;   COLPOSCOPY  2009/2010   no bx done   INTRAUTERINE DEVICE (IUD) INSERTION  10/24/2013    Allergies  Allergen Reactions   Amoxicillin Nausea And Vomiting    Other reaction(s): GI intolerance   Ciprofloxacin Swelling    Pitting edema    Doxycycline Nausea And Vomiting   Hydrocodone Hives   Methylprednisolone Sodium Succ Hives   Other Hives    methylprednisone   Penicillins Nausea And Vomiting   Latex Rash    Current  Outpatient Medications  Medication Sig Dispense Refill   Cholecalciferol (VITAMIN D3) 125 MCG (5000 UT) CAPS Take by mouth.     ibuprofen (ADVIL) 600 MG tablet Take 1 tablet (600 mg total) by mouth every 6 (six) hours. 60 tablet 1   nystatin-triamcinolone ointment (MYCOLOG) Apply 1 application. topically 2 (two) times daily. 30 g 0   ondansetron (ZOFRAN-ODT) 4 MG disintegrating tablet Take 1 tablet (4 mg total) by mouth every 6 (six) hours as needed for nausea or vomiting. 20 tablet 0   Prenatal Vit-Fe Fumarate-FA (PRENATAL MULTIVITAMIN) TABS tablet Take 1 tablet by mouth daily at 12 noon.     zinc gluconate 50 MG tablet Take 50 mg by mouth daily.     No current facility-administered medications for this visit.    Family History Family History  Problem Relation Age of Onset   Cancer Mother        cervical   Hyperlipidemia Mother    Hypertension Mother    Hyperlipidemia Father    Diabetes Maternal Grandmother    Heart disease Maternal Grandfather       Social History Social History   Tobacco Use   Smoking status: Never   Smokeless tobacco: Never  Vaping Use   Vaping Use: Never used  Substance Use Topics   Alcohol use: Not Currently   Drug use: No        Review of Systems  All other systems reviewed  and are negative.     Physical Exam Blood pressure 122/83, pulse 94, temperature 98.1 F (36.7 C), temperature source Oral, height 5' 2" (1.575 m), weight (!) 300 lb 12.8 oz (136.4 kg), SpO2 98 %, currently breastfeeding. Last Weight  Most recent update: 02/04/2022 10:37 AM    Weight  136.4 kg (300 lb 12.8 oz)               CONSTITUTIONAL: Well developed, morbidly obese, and well nourished, appropriately responsive and aware without distress.   EYES: Sclera non-icteric.   EARS, NOSE, MOUTH AND THROAT:  The oropharynx is clear. Oral mucosa is pink and moist.   Hearing is intact to voice.  NECK: Trachea is midline, and there is no jugular venous distension.  LYMPH  NODES:  Lymph nodes in the neck are not enlarged. RESPIRATORY:  Lungs are clear, and breath sounds are equal bilaterally. Normal respiratory effort without pathologic use of accessory muscles. CARDIOVASCULAR: Heart is regular in rate and rhythm. GI: The abdomen is soft, nontender, and nondistended. There were no palpable masses. I did not appreciate hepatosplenomegaly. There were normal bowel sounds. MUSCULOSKELETAL:  Symmetrical muscle tone appreciated in all four extremities.    SKIN: Skin turgor is normal. No pathologic skin lesions appreciated.  NEUROLOGIC:  Motor and sensation appear grossly normal.  Cranial nerves are grossly without defect. PSYCH:  Alert and oriented to person, place and time. Affect is appropriate for situation.  Data Reviewed I have personally reviewed what is currently available of the patient's imaging, recent labs and medical records.   Labs:     Latest Ref Rng & Units 01/14/2022    7:47 PM 01/01/2022    6:43 PM 10/22/2021    7:05 AM  CBC  WBC 4.0 - 10.5 K/uL 11.6  12.8  18.0   Hemoglobin 12.0 - 15.0 g/dL 12.5  12.7  11.1   Hematocrit 36.0 - 46.0 % 39.4  40.1  33.2   Platelets 150 - 400 K/uL 318  327  277       Latest Ref Rng & Units 01/14/2022    7:47 PM 01/01/2022    9:57 PM 01/01/2022    6:43 PM  CMP  Glucose 70 - 99 mg/dL 114   123   BUN 6 - 20 mg/dL 13   18   Creatinine 0.44 - 1.00 mg/dL 0.73   0.76   Sodium 135 - 145 mmol/L 142   139   Potassium 3.5 - 5.1 mmol/L 3.7   3.5   Chloride 98 - 111 mmol/L 107   105   CO2 22 - 32 mmol/L 27   24   Calcium 8.9 - 10.3 mg/dL 9.2   9.1   Total Protein 6.5 - 8.1 g/dL 7.6  7.7    Total Bilirubin 0.3 - 1.2 mg/dL 1.3  1.0    Alkaline Phos 38 - 126 U/L 174  131    AST 15 - 41 U/L 151  200    ALT 0 - 44 U/L 121  147      Imaging: Radiology review:  CLINICAL DATA:  Abdominal pain.   EXAM: ULTRASOUND ABDOMEN LIMITED RIGHT UPPER QUADRANT   COMPARISON:  None Available.   FINDINGS: Gallbladder:    Physiologically distended, multiple layering gallstones. No gallbladder wall thickening. No pericholecystic fluid. No sonographic Murphy sign noted by sonographer.   Common bile duct:   Diameter: 2-3 mm, normal   Liver:   No focal lesion identified. Within normal limits   in parenchymal echogenicity. Portal vein is patent on color Doppler imaging with normal direction of blood flow towards the liver.   Other: No right upper quadrant ascites.   IMPRESSION: 1. Gallstones without sonographic findings of acute cholecystitis. 2. No biliary dilatation. 3. Normal sonographic appearance of the liver.     Electronically Signed   By: Narda Rutherford M.D.   On: 01/15/2022 00:36  Within last 24 hrs: No results found.  Assessment     Patient Active Problem List   Diagnosis Date Noted   CCC (chronic calculous cholecystitis) 02/04/2022   Morbid obesity (HCC) 12/02/2021   Breech presentation, no version 10/16/2021   Health education/counseling 10/07/2021   PSVT (paroxysmal supraventricular tachycardia) (HCC) 08/09/2021   BMI 50.0-59.9, adult (HCC) 04/27/2021   History of cervical dysplasia 08/18/2020   Decreased libido 05/29/2018   Anxiety and depression 05/29/2018    Plan    Robotic cholecystectomy with ICG imaging. This was discussed thoroughly.  Optimal plan is for robotic cholecystectomy utilizing ICG imaging. Risks and benefits have been discussed with the patient which include but are not limited to anesthesia, bleeding, infection, biliary ductal injury, resulting in leak or stenosis, other associated unanticipated injuries affiliated with laparoscopic surgery.   Reviewed that removing the gallbladder will only address the symptoms related to the gallbladder itself.  I believe there is the desire to proceed, accepting the risks with understanding.  Questions elicited and answered to satisfaction.    No guarantees ever expressed or implied.   Face-to-face time spent with the  patient and accompanying care providers(if present) was 30 minutes, with more than 50% of the time spent counseling, educating, and coordinating care of the patient.    These notes generated with voice recognition software. I apologize for typographical errors.  [name] M.D., FACS 02/04/2022, 11:04 AM

## 2022-02-04 NOTE — Telephone Encounter (Signed)
Patient has been advised of Pre-Admission date/time, and Surgery date.  Surgery Date: 02/12/22 Preadmission Testing Date: 02/11/22 (phone 8a-1p)  Patient has been made aware to call [phone], between 1-3:00pm the day before surgery, to find out what time to arrive for surgery.

## 2022-02-04 NOTE — Progress Notes (Signed)
Patient ID: Kristina Mccall, female   DOB: 07/24/1985, 36 y.o.   MRN: [id number]  Chief Complaint: Calculus cholecystitis  History of Present Illness Kristina Mccall is a 36 y.o. female with bandlike right upper quadrant to back pain postprandial.  She reports associated bloating and loose stools.   She denies nausea, vomiting, fevers or chills.  Her most severe attacks seem to be preceded by [employer] fries.  She reports her husband also likes to cook with butter and exacerbates her issues as well.  She is currently taking Zofran and oxycodone, while trying to nurse her infant girl.  She reports the pain last 3 to 4 hours at times, with the last attack being this last Sunday.  Uncertain what the provocation was at that time.  She denies any history of jaundice or acholic stools.  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; 02/11/2010;11/27/2012; 12/15/2012   positive high risk   LGSIL on Pap smear of cervix 03/18/2009   lgsil/hpv present      Past Surgical History:  Procedure Laterality Date   CESAREAN SECTION N/A 10/22/2021   Procedure: CESAREAN SECTION PRIMARY LOW-TRANSVERSE;  Surgeon: Hildred Laser, MD;  Location: [facility] ORS;  Service: Obstetrics;  Laterality: N/A;   COLPOSCOPY  2009/2010   no bx done   INTRAUTERINE DEVICE (IUD) INSERTION  10/24/2013    Allergies  Allergen Reactions   Amoxicillin Nausea And Vomiting    Other reaction(s): GI intolerance   Ciprofloxacin Swelling    Pitting edema    Doxycycline Nausea And Vomiting   Hydrocodone Hives   Methylprednisolone Sodium Succ Hives   Other Hives    methylprednisone   Penicillins Nausea And Vomiting   Latex Rash    Current  Outpatient Medications  Medication Sig Dispense Refill   Cholecalciferol (VITAMIN D3) 125 MCG (5000 UT) CAPS Take by mouth.     ibuprofen (ADVIL) 600 MG tablet Take 1 tablet (600 mg total) by mouth every 6 (six) hours. 60 tablet 1   nystatin-triamcinolone ointment (MYCOLOG) Apply 1 application. topically 2 (two) times daily. 30 g 0   ondansetron (ZOFRAN-ODT) 4 MG disintegrating tablet Take 1 tablet (4 mg total) by mouth every 6 (six) hours as needed for nausea or vomiting. 20 tablet 0   Prenatal Vit-Fe Fumarate-FA (PRENATAL MULTIVITAMIN) TABS tablet Take 1 tablet by mouth daily at 12 noon.     zinc gluconate 50 MG tablet Take 50 mg by mouth daily.     No current facility-administered medications for this visit.    Family History Family History  Problem Relation Age of Onset   Cancer Mother        cervical   Hyperlipidemia Mother    Hypertension Mother    Hyperlipidemia Father    Diabetes Maternal Grandmother    Heart disease Maternal Grandfather       Social History Social History   Tobacco Use   Smoking status: Never   Smokeless tobacco: Never  Vaping Use   Vaping Use: Never used  Substance Use Topics   Alcohol use: Not Currently   Drug use: No        Review of Systems  All other systems reviewed  and are negative.     Physical Exam Blood pressure 122/83, pulse 94, temperature 98.1 F (36.7 C), temperature source Oral, height 5\' 2"  (1.575 m), weight (!) 300 lb 12.8 oz (136.4 kg), SpO2 98 %, currently breastfeeding. Last Weight  Most recent update: 02/04/2022 10:37 AM    Weight  136.4 kg (300 lb 12.8 oz)               CONSTITUTIONAL: Well developed, morbidly obese, and well nourished, appropriately responsive and aware without distress.   EYES: Sclera non-icteric.   EARS, NOSE, MOUTH AND THROAT:  The oropharynx is clear. Oral mucosa is pink and moist.   Hearing is intact to voice.  NECK: Trachea is midline, and there is no jugular venous distension.  LYMPH  NODES:  Lymph nodes in the neck are not enlarged. RESPIRATORY:  Lungs are clear, and breath sounds are equal bilaterally. Normal respiratory effort without pathologic use of accessory muscles. CARDIOVASCULAR: Heart is regular in rate and rhythm. GI: The abdomen is soft, nontender, and nondistended. There were no palpable masses. I did not appreciate hepatosplenomegaly. There were normal bowel sounds. MUSCULOSKELETAL:  Symmetrical muscle tone appreciated in all four extremities.    SKIN: Skin turgor is normal. No pathologic skin lesions appreciated.  NEUROLOGIC:  Motor and sensation appear grossly normal.  Cranial nerves are grossly without defect. PSYCH:  Alert and oriented to person, place and time. Affect is appropriate for situation.  Data Reviewed I have personally reviewed what is currently available of the patient's imaging, recent labs and medical records.   Labs:     Latest Ref Rng & Units 01/14/2022    7:47 PM 01/01/2022    6:43 PM 10/22/2021    7:05 AM  CBC  WBC 4.0 - 10.5 K/uL 11.6  12.8  18.0   Hemoglobin 12.0 - 15.0 g/dL 10/24/2021  23.5  57.3   Hematocrit 36.0 - 46.0 % 39.4  40.1  33.2   Platelets 150 - 400 K/uL 318  327  277       Latest Ref Rng & Units 01/14/2022    7:47 PM 01/01/2022    9:57 PM 01/01/2022    6:43 PM  CMP  Glucose 70 - 99 mg/dL 03/04/2022   254   BUN 6 - 20 mg/dL 13   18   Creatinine 270 - 1.00 mg/dL 6.23   7.62   Sodium 8.31 - 145 mmol/L 142   139   Potassium 3.5 - 5.1 mmol/L 3.7   3.5   Chloride 98 - 111 mmol/L 107   105   CO2 22 - 32 mmol/L 27   24   Calcium 8.9 - 10.3 mg/dL 9.2   9.1   Total Protein 6.5 - 8.1 g/dL 7.6  7.7    Total Bilirubin 0.3 - 1.2 mg/dL 1.3  1.0    Alkaline Phos 38 - 126 U/L 174  131    AST 15 - 41 U/L 151  200    ALT 0 - 44 U/L 121  147      Imaging: Radiology review:  CLINICAL DATA:  Abdominal pain.   EXAM: ULTRASOUND ABDOMEN LIMITED RIGHT UPPER QUADRANT   COMPARISON:  None Available.   FINDINGS: Gallbladder:    Physiologically distended, multiple layering gallstones. No gallbladder wall thickening. No pericholecystic fluid. No sonographic Murphy sign noted by sonographer.   Common bile duct:   Diameter: 2-3 mm, normal   Liver:   No focal lesion identified. Within normal limits  in parenchymal echogenicity. Portal vein is patent on color Doppler imaging with normal direction of blood flow towards the liver.   Other: No right upper quadrant ascites.   IMPRESSION: 1. Gallstones without sonographic findings of acute cholecystitis. 2. No biliary dilatation. 3. Normal sonographic appearance of the liver.     Electronically Signed   By: Narda Rutherford M.D.   On: 01/15/2022 00:36  Within last 24 hrs: No results found.  Assessment     Patient Active Problem List   Diagnosis Date Noted   CCC (chronic calculous cholecystitis) 02/04/2022   Morbid obesity (HCC) 12/02/2021   Breech presentation, no version 10/16/2021   Health education/counseling 10/07/2021   PSVT (paroxysmal supraventricular tachycardia) (HCC) 08/09/2021   BMI 50.0-59.9, adult (HCC) 04/27/2021   History of cervical dysplasia 08/18/2020   Decreased libido 05/29/2018   Anxiety and depression 05/29/2018    Plan    Robotic cholecystectomy with ICG imaging. This was discussed thoroughly.  Optimal plan is for robotic cholecystectomy utilizing ICG imaging. Risks and benefits have been discussed with the patient which include but are not limited to anesthesia, bleeding, infection, biliary ductal injury, resulting in leak or stenosis, other associated unanticipated injuries affiliated with laparoscopic surgery.   Reviewed that removing the gallbladder will only address the symptoms related to the gallbladder itself.  I believe there is the desire to proceed, accepting the risks with understanding.  Questions elicited and answered to satisfaction.    No guarantees ever expressed or implied.   Face-to-face time spent with the  patient and accompanying care providers(if present) was 30 minutes, with more than 50% of the time spent counseling, educating, and coordinating care of the patient.    These notes generated with voice recognition software. I apologize for typographical errors.  [name] M.D., FACS 02/04/2022, 11:04 AM

## 2022-02-04 NOTE — Patient Instructions (Signed)
Our surgery scheduler Barbara will call you within 24-48 hours to get you scheduled. If you have not heard from her after 48 hours, please call our office. Have the blue sheet available when she calls to write down important information.   If you have any concerns or questions, please feel free to call our office.    Minimally Invasive Cholecystectomy  Minimally invasive cholecystectomy is surgery to remove the gallbladder. The gallbladder is a pear-shaped organ that lies beneath the liver on the right side of the body. The gallbladder stores bile, which is a fluid that helps the body digest fats. Cholecystectomy is often done to treat inflammation (irritation and swelling) of the gallbladder (cholecystitis). This condition is usually caused by a buildup of gallstones (cholelithiasis) in the gallbladder or when the fluid in the gall bladder becomes stagnant because gallstones get stuck in the ducts (tubes) and block the flow of bile. This can result in inflammation and pain. In severe cases, emergency surgery may be required. This procedure is done through small incisions in the abdomen, instead of one large incision. It is also called laparoscopic surgery. A thin scope with a camera (laparoscope) is inserted through one incision. Then surgical instruments are inserted through the other incisions. In some cases, a minimally invasive surgery may need to be changed to a surgery that is done through a larger incision. This is called open surgery. Tell a health care provider about: Any allergies you have. All medicines you are taking, including vitamins, herbs, eye drops, creams, and over-the-counter medicines. Any problems you or family members have had with anesthetic medicines. Any bleeding problems you have. Any surgeries you have had. Any medical conditions you have. Whether you are pregnant or may be pregnant. What are the risks? Generally, this is a safe procedure. However, problems may occur,  including: Infection. Bleeding. Allergic reactions to medicines. Damage to nearby structures or organs. A gallstone remaining in the common bile duct. The common bile duct carries bile from the gallbladder to the small intestine. A bile leak from the liver or cystic duct after your gallbladder is removed. What happens before the procedure? When to stop eating and drinking Follow instructions from your health care provider about what you may eat and drink before your procedure. These may include: 8 hours before the procedure Stop eating most foods. Do not eat meat, fried foods, or fatty foods. Eat only light foods, such as toast or crackers. All liquids are okay except energy drinks and alcohol. 6 hours before the procedure Stop eating. Drink only clear liquids, such as water, clear fruit juice, black coffee, plain tea, and sports drinks. Do not drink energy drinks or alcohol. 2 hours before the procedure Stop drinking all liquids. You may be allowed to take medicines with small sips of water. If you do not follow your health care provider's instructions, your procedure may be delayed or canceled. Medicines Ask your health care provider about: Changing or stopping your regular medicines. This is especially important if you are taking diabetes medicines or blood thinners. Taking medicines such as aspirin and ibuprofen. These medicines can thin your blood. Do not take these medicines unless your health care provider tells you to take them. Taking over-the-counter medicines, vitamins, herbs, and supplements. General instructions If you will be going home right after the procedure, plan to have a responsible adult: Take you home from the hospital or clinic. You will not be allowed to drive. Care for you for the time you are   told. Do not use any products that contain nicotine or tobacco for at least 4 weeks before the procedure. These products include cigarettes, chewing tobacco, and vaping  devices, such as e-cigarettes. If you need help quitting, ask your health care provider. Ask your health care provider: How your surgery site will be marked. What steps will be taken to help prevent infection. These may include: Removing hair at the surgery site. Washing skin with a germ-killing soap. Taking antibiotic medicine. What happens during the procedure?  An IV will be inserted into one of your veins. You will be given one or both of the following: A medicine to help you relax (sedative). A medicine to make you fall asleep (general anesthetic). Your surgeon will make several small incisions in your abdomen. The laparoscope will be inserted through one of the small incisions. The camera on the laparoscope will send images to a monitor in the operating room. This lets your surgeon see inside your abdomen. A gas will be pumped into your abdomen. This will expand your abdomen to give the surgeon more room to perform the surgery. Other tools that are needed for the procedure will be inserted through the other incisions. The gallbladder will be removed through one of the incisions. Your common bile duct may be examined. If stones are found in the common bile duct, they may be removed. After your gallbladder has been removed, the incisions will be closed with stitches (sutures), staples, or skin glue. Your incisions will be covered with a bandage (dressing). The procedure may vary among health care providers and hospitals. What happens after the procedure? Your blood pressure, heart rate, breathing rate, and blood oxygen level will be monitored until you leave the hospital or clinic. You will be given medicines as needed to control your pain. You may have a drain placed in the incision. The drain will be removed a day or two after the procedure. Summary Minimally invasive cholecystectomy, also called laparoscopic cholecystectomy, is surgery to remove the gallbladder using small  incisions. Tell your health care provider about all the medical conditions you have and all the medicines you are taking for those conditions. Before the procedure, follow instructions about when to stop eating and drinking and changing or stopping medicines. Plan to have a responsible adult care for you for the time you are told after you leave the hospital or clinic. This information is not intended to replace advice given to you by your health care provider. Make sure you discuss any questions you have with your health care provider. Document Revised: 12/16/2020 Document Reviewed: 12/16/2020 Elsevier Patient Education  2023 [employer]  

## 2022-02-11 ENCOUNTER — Encounter: Payer: Self-pay | Admitting: *Deleted

## 2022-02-11 ENCOUNTER — Encounter
Admission: RE | Admit: 2022-02-11 | Discharge: 2022-02-11 | Disposition: A | Discharge status: Home / Self Care | Payer: Managed Care, Other (non HMO) | Source: Ambulatory Visit | Attending: Surgery | Admitting: Surgery

## 2022-02-11 VITALS — Ht 62.0 in | Wt 300.0 lb

## 2022-02-11 DIAGNOSIS — Z01812 Encounter for preprocedural laboratory examination: Secondary | ICD-10-CM

## 2022-02-11 HISTORY — DX: Cardiac arrhythmia, unspecified: I49.9

## 2022-02-11 MED ORDER — FAMOTIDINE 20 MG PO TABS: 20.0000 mg | ORAL_TABLET | Freq: Once | ORAL | Status: DC

## 2022-02-11 MED ORDER — CHLORHEXIDINE GLUCONATE 0.12 % MT SOLN
15.0000 mL | Freq: Once | OROMUCOSAL | Status: AC
  Administered 2022-02-12: 15 mL via OROMUCOSAL

## 2022-02-11 MED ORDER — CHLORHEXIDINE GLUCONATE CLOTH 2 % EX PADS: 6.0000 | MEDICATED_PAD | Freq: Once | CUTANEOUS | Status: DC

## 2022-02-11 MED ORDER — CEFAZOLIN SODIUM-DEXTROSE 2-4 GM/100ML-% IV SOLN
2.0000 g | INTRAVENOUS | Status: AC
  Administered 2022-02-12: 2 g via INTRAVENOUS

## 2022-02-11 MED ORDER — LACTATED RINGERS IV SOLN: INTRAVENOUS | Status: DC

## 2022-02-11 MED ORDER — ORAL CARE MOUTH RINSE: 15.0000 mL | Freq: Once | OROMUCOSAL | Status: AC

## 2022-02-11 MED ORDER — CELECOXIB 200 MG PO CAPS
200.0000 mg | ORAL_CAPSULE | ORAL | Status: AC
  Administered 2022-02-12: 200 mg via ORAL

## 2022-02-11 MED ORDER — BUPIVACAINE LIPOSOME 1.3 % IJ SUSP: 20.0000 mL | Freq: Once | INTRAMUSCULAR | Status: DC

## 2022-02-11 MED ORDER — ACETAMINOPHEN 500 MG PO TABS
1000.0000 mg | ORAL_TABLET | ORAL | Status: AC
  Administered 2022-02-12: 1000 mg via ORAL

## 2022-02-11 MED ORDER — INDOCYANINE GREEN 25 MG IV SOLR
1.2500 mg | Freq: Once | INTRAVENOUS | Status: AC
  Administered 2022-02-12: 1.25 mg via INTRAVENOUS
  Filled 2022-02-11: qty 0.5

## 2022-02-11 MED ORDER — GABAPENTIN 300 MG PO CAPS
300.0000 mg | ORAL_CAPSULE | ORAL | Status: AC
  Administered 2022-02-12: 300 mg via ORAL

## 2022-02-11 NOTE — Patient Instructions (Addendum)
Your procedure is scheduled on: Friday February 12, 2022. Report to Day Surgery inside [facility] 2nd floor, stop by admissions desk before getting on elevator.  To find out your arrival time please call [phone] between 1PM - 3PM on Thursday February 11, 2022.  Remember: Instructions that are not followed completely may result in serious medical risk,  up to and including death, or upon the discretion of your surgeon and anesthesiologist your  surgery may need to be rescheduled.     _X__ 1. Do not eat food or drink fluids after midnight the night before your procedure.                 No chewing gum or hard candies.   __X__2.  On the morning of surgery brush your teeth with toothpaste and water, you                may rinse your mouth with mouthwash if you wish.  Do not swallow any toothpaste or mouthwash.     _X__ 3.  No Alcohol for 24 hours before or after surgery.   _X__ 4.  Do Not Smoke or use e-cigarettes For 24 Hours Prior to Your Surgery.                 Do not use any chewable tobacco products for at least 6 hours prior to                 Surgery.  _X__  5.  Do not use any recreational drugs (marijuana, cocaine, heroin, ecstasy, MDMA or other)                For at least one week prior to your surgery.  Combination of these drugs with anesthesia                May have life threatening results.  ____  6.  Bring all medications with you on the day of surgery if instructed.   __X__  7.  Notify your doctor if there is any change in your medical condition      (cold, fever, infections).     Do not wear jewelry, make-up, hairpins, clips or nail polish. Do not wear lotions, powders, or perfumes. You may wear deodorant. Do not shave 48 hours prior to surgery.  Do not bring valuables to the hospital.    [facility] is not responsible for any belongings or valuables.  Contacts, dentures or bridgework may not be worn into surgery. Leave your suitcase in  the car. After surgery it may be brought to your room. For patients admitted to the hospital, discharge time is determined by your treatment team.   Patients discharged the day of surgery will not be allowed to drive home.   Make arrangements for someone to be with you for the first 24 hours of your Same Day Discharge.   __X__ Take these medicines the morning of surgery with A SIP OF WATER:    1. None   2.   3.   4.  5.  6.  ____ Fleet Enema (as directed)   __X__ Use CHG Soap (or wipes) as directed  ____ Use Benzoyl Peroxide Gel as instructed  ____ Use inhalers on the day of surgery  ____ Stop metformin 2 days prior to surgery    ____ Take 1/2 of usual insulin dose the night before surgery. No insulin the morning          of  surgery.   ____ Call your PCP, cardiologist, or Pulmonologist if taking Coumadin/Plavix/aspirin and ask when to stop before your surgery.   __X__ One Week prior to surgery- Stop Anti-inflammatories such as Ibuprofen, Aleve, Advil, Motrin, meloxicam (MOBIC), diclofenac, etodolac, ketorolac, Toradol, Daypro, piroxicam, Goody's or BC powders. OK TO USE TYLENOL IF NEEDED   __X__ Stop supplements until after surgery.    ____ Bring C-Pap to the hospital.    If you have any questions regarding your pre-procedure instructions,  Please call Pre-admit Testing at [phone]

## 2022-02-12 ENCOUNTER — Encounter: Payer: Self-pay | Admitting: Surgery

## 2022-02-12 ENCOUNTER — Encounter
Admission: RE | Disposition: A | Discharge status: Home / Self Care | Payer: Self-pay | Source: Home / Self Care | Attending: Surgery

## 2022-02-12 ENCOUNTER — Ambulatory Visit: Payer: Managed Care, Other (non HMO) | Admitting: Anesthesiology

## 2022-02-12 ENCOUNTER — Other Ambulatory Visit: Payer: Self-pay

## 2022-02-12 ENCOUNTER — Ambulatory Visit
Admission: RE | Admit: 2022-02-12 | Discharge: 2022-02-12 | Disposition: A | Discharge status: Home / Self Care | Payer: Managed Care, Other (non HMO) | Attending: Surgery | Admitting: Surgery

## 2022-02-12 DIAGNOSIS — Z01812 Encounter for preprocedural laboratory examination: Secondary | ICD-10-CM

## 2022-02-12 DIAGNOSIS — K219 Gastro-esophageal reflux disease without esophagitis: Secondary | ICD-10-CM | POA: Diagnosis not present

## 2022-02-12 DIAGNOSIS — K8010 Calculus of gallbladder with chronic cholecystitis without obstruction: Secondary | ICD-10-CM | POA: Diagnosis present

## 2022-02-12 LAB — POCT PREGNANCY, URINE: Preg Test, Ur: NEGATIVE

## 2022-02-12 SURGERY — CHOLECYSTECTOMY, ROBOT-ASSISTED, LAPAROSCOPIC
Anesthesia: General

## 2022-02-12 MED ORDER — CHLORHEXIDINE GLUCONATE 0.12 % MT SOLN
OROMUCOSAL | Status: AC
  Filled 2022-02-12: qty 15

## 2022-02-12 MED ORDER — CELECOXIB 200 MG PO CAPS
ORAL_CAPSULE | ORAL | Status: AC
  Filled 2022-02-12: qty 1

## 2022-02-12 MED ORDER — BUPIVACAINE-EPINEPHRINE (PF) 0.5% -1:200000 IJ SOLN
INTRAMUSCULAR | Status: DC | PRN
  Administered 2022-02-12: 30 mL via INTRAMUSCULAR

## 2022-02-12 MED ORDER — ROCURONIUM BROMIDE 10 MG/ML (PF) SYRINGE
PREFILLED_SYRINGE | INTRAVENOUS | Status: AC
  Filled 2022-02-12: qty 10

## 2022-02-12 MED ORDER — FENTANYL CITRATE (PF) 100 MCG/2ML IJ SOLN
INTRAMUSCULAR | Status: AC
  Filled 2022-02-12: qty 2

## 2022-02-12 MED ORDER — LACTATED RINGERS IV SOLN: INTRAVENOUS | Status: DC | PRN

## 2022-02-12 MED ORDER — SUGAMMADEX SODIUM 500 MG/5ML IV SOLN
INTRAVENOUS | Status: DC | PRN
  Administered 2022-02-12: 300 mg via INTRAVENOUS

## 2022-02-12 MED ORDER — FENTANYL CITRATE (PF) 100 MCG/2ML IJ SOLN
25.0000 ug | INTRAMUSCULAR | Status: DC | PRN
  Administered 2022-02-12: 25 ug via INTRAVENOUS

## 2022-02-12 MED ORDER — BUPIVACAINE LIPOSOME 1.3 % IJ SUSP
INTRAMUSCULAR | Status: AC
  Filled 2022-02-12: qty 20

## 2022-02-12 MED ORDER — CEFAZOLIN SODIUM-DEXTROSE 2-4 GM/100ML-% IV SOLN
INTRAVENOUS | Status: AC
  Filled 2022-02-12: qty 100

## 2022-02-12 MED ORDER — ONDANSETRON HCL 4 MG/2ML IJ SOLN
INTRAMUSCULAR | Status: AC
  Filled 2022-02-12: qty 2

## 2022-02-12 MED ORDER — SUCCINYLCHOLINE CHLORIDE 200 MG/10ML IV SOSY
PREFILLED_SYRINGE | INTRAVENOUS | Status: DC | PRN
  Administered 2022-02-12: 100 mg via INTRAVENOUS

## 2022-02-12 MED ORDER — OXYCODONE HCL 5 MG PO TABS: 5.0000 mg | ORAL_TABLET | Freq: Four times a day (QID) | ORAL | 0 refills | Status: DC | PRN

## 2022-02-12 MED ORDER — OXYCODONE HCL 5 MG PO TABS
ORAL_TABLET | ORAL | Status: AC
  Filled 2022-02-12: qty 1

## 2022-02-12 MED ORDER — PROPOFOL 10 MG/ML IV BOLUS
INTRAVENOUS | Status: DC | PRN
  Administered 2022-02-12: 200 mg via INTRAVENOUS

## 2022-02-12 MED ORDER — GABAPENTIN 300 MG PO CAPS
ORAL_CAPSULE | ORAL | Status: AC
  Filled 2022-02-12: qty 1

## 2022-02-12 MED ORDER — OXYCODONE HCL 5 MG/5ML PO SOLN: 5.0000 mg | Freq: Once | ORAL | Status: AC | PRN

## 2022-02-12 MED ORDER — ACETAMINOPHEN 500 MG PO TABS
ORAL_TABLET | ORAL | Status: AC
  Filled 2022-02-12: qty 2

## 2022-02-12 MED ORDER — DEXMEDETOMIDINE (PRECEDEX) IN NS 20 MCG/5ML (4 MCG/ML) IV SYRINGE
PREFILLED_SYRINGE | INTRAVENOUS | Status: DC | PRN
  Administered 2022-02-12 (×2): 4 ug via INTRAVENOUS

## 2022-02-12 MED ORDER — ROCURONIUM BROMIDE 100 MG/10ML IV SOLN
INTRAVENOUS | Status: DC | PRN
  Administered 2022-02-12: 40 mg via INTRAVENOUS

## 2022-02-12 MED ORDER — ONDANSETRON HCL 4 MG/2ML IJ SOLN
INTRAMUSCULAR | Status: DC | PRN
  Administered 2022-02-12: 4 mg via INTRAVENOUS

## 2022-02-12 MED ORDER — OXYCODONE HCL 5 MG PO TABS
5.0000 mg | ORAL_TABLET | Freq: Once | ORAL | Status: AC | PRN
  Administered 2022-02-12: 5 mg via ORAL

## 2022-02-12 MED ORDER — MIDAZOLAM HCL 2 MG/2ML IJ SOLN
INTRAMUSCULAR | Status: AC
  Filled 2022-02-12: qty 2

## 2022-02-12 MED ORDER — BUPIVACAINE-EPINEPHRINE (PF) 0.5% -1:200000 IJ SOLN
INTRAMUSCULAR | Status: AC
  Filled 2022-02-12: qty 30

## 2022-02-12 MED ORDER — FENTANYL CITRATE (PF) 100 MCG/2ML IJ SOLN
INTRAMUSCULAR | Status: DC | PRN
Start: 2022-02-12 — End: 2022-02-12
  Administered 2022-02-12: 100 ug via INTRAVENOUS

## 2022-02-12 MED ORDER — SUCCINYLCHOLINE CHLORIDE 200 MG/10ML IV SOSY
PREFILLED_SYRINGE | INTRAVENOUS | Status: AC
  Filled 2022-02-12: qty 10

## 2022-02-12 SURGICAL SUPPLY — 47 items
ADH SKN CLS APL DERMABOND .7 (GAUZE/BANDAGES/DRESSINGS) ×1
BAG PRESSURE INF REUSE 3000 (BAG) IMPLANT
CLIP LIGATING HEM O LOK PURPLE (MISCELLANEOUS) ×1 IMPLANT
COVER TIP SHEARS 8 DVNC (MISCELLANEOUS) ×1 IMPLANT
COVER TIP SHEARS 8MM DA VINCI (MISCELLANEOUS) ×1
DERMABOND ADVANCED (GAUZE/BANDAGES/DRESSINGS) ×1
DERMABOND ADVANCED .7 DNX12 (GAUZE/BANDAGES/DRESSINGS) ×1 IMPLANT
DRAPE ARM DVNC X/XI (DISPOSABLE) ×4 IMPLANT
DRAPE COLUMN DVNC XI (DISPOSABLE) ×1 IMPLANT
DRAPE DA VINCI XI ARM (DISPOSABLE) ×4
DRAPE DA VINCI XI COLUMN (DISPOSABLE) ×1
ELECT CAUTERY BLADE 6.4 (BLADE) ×1 IMPLANT
GLOVE ORTHO TXT STRL SZ7.5 (GLOVE) ×2 IMPLANT
GOWN STRL REUS W/ TWL LRG LVL3 (GOWN DISPOSABLE) ×2 IMPLANT
GOWN STRL REUS W/ TWL XL LVL3 (GOWN DISPOSABLE) ×2 IMPLANT
GOWN STRL REUS W/TWL LRG LVL3 (GOWN DISPOSABLE) ×2
GOWN STRL REUS W/TWL XL LVL3 (GOWN DISPOSABLE) ×2
GRASPER SUT TROCAR 14GX15 (MISCELLANEOUS) IMPLANT
IRRIGATION STRYKERFLOW (MISCELLANEOUS) IMPLANT
IRRIGATOR STRYKERFLOW (MISCELLANEOUS)
IRRIGATOR SUCT 8 DISP DVNC XI (IRRIGATION / IRRIGATOR) IMPLANT
IRRIGATOR SUCTION 8MM XI DISP (IRRIGATION / IRRIGATOR)
IV NS IRRIG 3000ML ARTHROMATIC (IV SOLUTION) IMPLANT
KIT PINK PAD W/HEAD ARE REST (MISCELLANEOUS) ×1
KIT PINK PAD W/HEAD ARM REST (MISCELLANEOUS) ×1 IMPLANT
KIT TURNOVER KIT A (KITS) ×1 IMPLANT
LABEL OR SOLS (LABEL) ×1 IMPLANT
MANIFOLD NEPTUNE II (INSTRUMENTS) ×1 IMPLANT
NDL INSUFFLATION 14GA 120MM (NEEDLE) IMPLANT
NEEDLE HYPO 22GX1.5 SAFETY (NEEDLE) ×1 IMPLANT
NEEDLE INSUFFLATION 14GA 120MM (NEEDLE) IMPLANT
NS IRRIG 500ML POUR BTL (IV SOLUTION) ×1 IMPLANT
PACK LAP CHOLECYSTECTOMY (MISCELLANEOUS) ×1 IMPLANT
SEAL CANN UNIV 5-8 DVNC XI (MISCELLANEOUS) ×4 IMPLANT
SEAL XI 5MM-8MM UNIVERSAL (MISCELLANEOUS) ×4
SET TUBE SMOKE EVAC HIGH FLOW (TUBING) ×1 IMPLANT
SOLUTION ELECTROLUBE (MISCELLANEOUS) ×1 IMPLANT
SPIKE FLUID TRANSFER (MISCELLANEOUS) ×1 IMPLANT
SUT MNCRL 4-0 (SUTURE) ×1
SUT MNCRL 4-0 27XMFL (SUTURE) ×1
SUT VICRYL 0 AB UR-6 (SUTURE) ×1 IMPLANT
SUTURE MNCRL 4-0 27XMF (SUTURE) ×1 IMPLANT
SYS BAG RETRIEVAL 10MM (BASKET) ×1
SYSTEM BAG RETRIEVAL 10MM (BASKET) ×1 IMPLANT
TRAP FLUID SMOKE EVACUATOR (MISCELLANEOUS) ×1 IMPLANT
TROCAR Z-THREAD FIOS 11X100 BL (TROCAR) IMPLANT
WATER STERILE IRR 500ML POUR (IV SOLUTION) ×1 IMPLANT

## 2022-02-12 NOTE — Op Note (Signed)
Robotic cholecystectomy with Indocyamine Green Ductal Imaging.   Pre-operative Diagnosis: Chronic calculus cholecystitis  Post-operative Diagnosis:  Same.  Procedure: Robotic assisted laparoscopic cholecystectomy with Indocyamine Green Ductal Imaging.   Surgeon: [name], M.D., FACS  Anesthesia: General. with endotracheal tube  Findings: As expected  Estimated Blood Loss: 15 mL         Drains: None         Specimens: Gallbladder           Complications: none  Procedure Details  The patient was seen again in the Holding Room.  1.25 mg dose of ICG was administered intravenously.   The benefits, complications, treatment options, risks and expected outcomes were again reviewed with the patient. The likelihood of improving the patient's symptoms with return to their baseline status is good.  The patient and/or family concurred with the proposed plan, giving informed consent, again alternatives reviewed.  The patient was taken to Operating Room, identified, and the procedure verified as robotic assisted laparoscopic cholecystectomy.  Prior to the induction of general anesthesia, antibiotic prophylaxis was administered. VTE prophylaxis was in place. General endotracheal anesthesia was then administered and tolerated well. The patient was positioned in the supine position.  After the induction, the abdomen was prepped with Chloraprep and draped in the sterile fashion.  A Time Out was held and the above information confirmed.  After local infiltration of quarter percent Marcaine with epinephrine, stab incision was made left upper quadrant.  Just below the costal margin at Palmer's point, approximately midclavicular line the Veres needle is passed with sensation of the layers to penetrate the abdominal wall and into the peritoneum.  Saline drop test is confirmed peritoneal placement.  Insufflation is initiated with carbon dioxide to pressures of 15 mmHg.  Right para-umbilical local  infiltration with quarter percent Marcaine with epinephrine is utilized.  Made a 12 mm incision on the right periumbilical site, I advanced an optical 72mm port under direct visualization into the peritoneal cavity.  Once the peritoneum was penetrated, insufflation was initiated.  The trocar was then advanced into the abdominal cavity under direct visualization. Pneumoperitoneum was then continued utilizing CO2 at 15 mmHg or less and tolerated well without any adverse changes in the patient's vital signs.  Two 8.5-mm ports were placed in the left lower quadrant and laterally, and one to the right lower quadrant, all under direct vision. All skin incisions  were infiltrated with a local anesthetic agent before making the incision and placing the trocars.  The patient was positioned  in reverse Trendelenburg, tilted the patient's left side down.  Da Vinci XI robot was then positioned on to the patient's left side, and docked.  The gallbladder was identified, the fundus grasped via the arm 4 Prograsp and retracted cephalad. Adhesions were lysed with scissors and cautery.  The infundibulum was identified grasped and retracted laterally, exposing the peritoneum overlying the triangle of Calot. This was then opened and dissected using cautery & scissors. An extended critical view of the cystic duct and cystic artery was obtained, aided by the ICG via FireFly which improved localization of the ductal anatomy.    The cystic duct was clearly identified and dissected to isolation.   Artery well isolated and clipped, and the cystic duct was triple clipped and divided with scissors, as close to the gallbladder neck as feasible, thus leaving two on the remaining stump.  The specimen side of the artery is sealed with bipolar and divided with monopolar scissors.   The  gallbladder was taken from the gallbladder fossa in a retrograde fashion with the electrocautery. The gallbladder was removed and placed in an Endocatch bag.   The liver bed is inspected. Hemostasis was confirmed.  The robot was undocked and moved away from the operative field. No irrigation was utilized. The gallbladder and Endocatch sac were then removed through the infraumbilical port site.   Inspection of the right upper quadrant was performed. No bleeding, bile duct injury or leak, or bowel injury was noted. The infra-umbilical port site fascia was closed with interrumpted 0 Vicryl sutures using PMI/cone under direct visualization. Pneumoperitoneum was released and ports removed.  4-0 subcuticular Monocryl was used to close the skin. Dermabond was  applied.  The patient was then extubated and brought to the recovery room in stable condition. Sponge, lap, and needle counts were correct at closure and at the conclusion of the case.               [name], M.D., [facility] 02/12/2022 10:46 AM

## 2022-02-12 NOTE — Anesthesia Procedure Notes (Signed)
Procedure Name: Intubation Date/Time: 02/12/2022 9:32 AM  Performed by: Louann Sjogren, CRNAPre-anesthesia Checklist: Patient identified, Patient being monitored, Timeout performed, Emergency Drugs available and Suction available Patient Re-evaluated:Patient Re-evaluated prior to induction Oxygen Delivery Method: Circle system utilized Preoxygenation: Pre-oxygenation with 100% oxygen Induction Type: IV induction Ventilation: Mask ventilation without difficulty Laryngoscope Size: Mac and 4 Grade View: Grade II Tube type: Oral Tube size: 7.0 mm Number of attempts: 1 Airway Equipment and Method: Stylet Placement Confirmation: ETT inserted through vocal cords under direct vision, positive ETCO2 and breath sounds checked- equal and bilateral Secured at: 22 cm Tube secured with: Tape Dental Injury: Teeth and Oropharynx as per pre-operative assessment

## 2022-02-12 NOTE — Transfer of Care (Signed)
Immediate Anesthesia Transfer of Care Note  Patient: Kristina Mccall  Procedure(s) Performed: XI ROBOTIC ASSISTED LAPAROSCOPIC CHOLECYSTECTOMY INDOCYANINE GREEN FLUORESCENCE IMAGING (ICG)  Patient Location: PACU  Anesthesia Type:General  Level of Consciousness: awake, alert  and oriented  Airway & Oxygen Therapy: Patient Spontanous Breathing  Post-op Assessment: Report given to RN  Post vital signs: Reviewed and stable  Last Vitals:  Vitals Value Taken Time  BP 117/72 02/12/22 1045  Temp 36.1 C 02/12/22 1044  Pulse 63 02/12/22 1046  Resp 22 02/12/22 1046  SpO2 93 % 02/12/22 1046  Vitals shown include unvalidated device data.  Last Pain:  Vitals:   02/12/22 1044  TempSrc:   PainSc: 0-No pain         Complications: No notable events documented.

## 2022-02-12 NOTE — Discharge Instructions (Signed)

## 2022-02-12 NOTE — Interval H&P Note (Signed)
History and Physical Interval Note:  02/12/2022 8:58 AM  Kristina Mccall  has presented today for surgery, with the diagnosis of Chronic calculous cholecystitis K80.10.  The various methods of treatment have been discussed with the patient and family. After consideration of risks, benefits and other options for treatment, the patient has consented to  Procedure(s): XI ROBOTIC ASSISTED LAPAROSCOPIC CHOLECYSTECTOMY (N/A) INDOCYANINE GREEN FLUORESCENCE IMAGING (ICG) (N/A) as a surgical intervention.  The patient's history has been reviewed, patient examined, no change in status, stable for surgery.  I have reviewed the patient's chart and labs.  Questions were answered to the patient's satisfaction.     Kristina Mccall

## 2022-02-12 NOTE — Anesthesia Postprocedure Evaluation (Signed)
Anesthesia Post Note  Patient: Elvis Coil  Procedure(s) Performed: XI ROBOTIC ASSISTED LAPAROSCOPIC CHOLECYSTECTOMY INDOCYANINE GREEN FLUORESCENCE IMAGING (ICG)  Patient location during evaluation: PACU Anesthesia Type: General Level of consciousness: awake and alert Pain management: pain level controlled Vital Signs Assessment: post-procedure vital signs reviewed and stable Respiratory status: spontaneous breathing, nonlabored ventilation, respiratory function stable and patient connected to nasal cannula oxygen Cardiovascular status: blood pressure returned to baseline and stable Postop Assessment: no apparent nausea or vomiting Anesthetic complications: no   No notable events documented.   Last Vitals:  Vitals:   02/12/22 1200 02/12/22 1229  BP: 124/68 (!) 116/53  Pulse: (!) 52 (!) 50  Resp: 16 16  Temp:  (!) 36.1 C  SpO2: 98% 98%    Last Pain:  Vitals:   02/12/22 1229  TempSrc: Temporal  PainSc: 5                  Cleda Mccreedy Sumiye Hirth

## 2022-02-12 NOTE — Anesthesia Preprocedure Evaluation (Signed)
Anesthesia Evaluation  Patient identified by MRN, date of birth, ID band Patient awake    Reviewed: Allergy & Precautions, NPO status , Patient's Chart, lab work & pertinent test results  History of Anesthesia Complications Negative for: history of anesthetic complications  Airway Mallampati: III  TM Distance: >3 FB Neck ROM: full    Dental  (+) Chipped   Pulmonary neg pulmonary ROS, neg shortness of breath,    Pulmonary exam normal        Cardiovascular Exercise Tolerance: Good + dysrhythmias Supra Ventricular Tachycardia      Neuro/Psych PSYCHIATRIC DISORDERS negative neurological ROS     GI/Hepatic negative GI ROS, Neg liver ROS, neg GERD  ,  Endo/Other  negative endocrine ROS  Renal/GU      Musculoskeletal   Abdominal   Peds  Hematology negative hematology ROS (+)   Anesthesia Other Findings Past Medical History: 05/28/2008;01/29/08;11/27/2012: Abnormal Pap smear of cervix No date: Arthritis No date: Bacterial vaginosis No date: Condyloma 06/2010: Degenerative disc disease, lumbar 2013: Depression No date: Dysrhythmia 04/26/2014: History of Papanicolaou smear of cervix     Comment:  -/-; 04/29/2015 neg 07/25/2009; 02/11/2010;11/27/2012; 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 03/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     Comment:  lgsil/hpv present  Past Surgical History: 10/22/2021: CESAREAN SECTION; N/A     Comment:  Procedure: CESAREAN SECTION PRIMARY LOW-TRANSVERSE;                Surgeon: Hildred Laser, MD;  Location: [facility] ORS;                Service: Obstetrics;  Laterality: N/A; 2009/2010: COLPOSCOPY     Comment:  no bx done 12/2021: INTRAUTERINE DEVICE (IUD) INSERTION  BMI    Body Mass Index: 54.87 kg/m      Reproductive/Obstetrics negative OB ROS                             Anesthesia Physical Anesthesia Plan  ASA: 3  Anesthesia Plan:  General ETT   Post-op Pain Management:    Induction: Intravenous  PONV Risk Score and Plan: Ondansetron, Dexamethasone, Midazolam and Treatment may vary due to age or medical condition  Airway Management Planned: Oral ETT and Video Laryngoscope Planned  Additional Equipment:   Intra-op Plan:   Post-operative Plan: Extubation in OR  Informed Consent: I have reviewed the patients History and Physical, chart, labs and discussed the procedure including the risks, benefits and alternatives for the proposed anesthesia with the patient or authorized representative who has indicated his/her understanding and acceptance.     Dental Advisory Given  Plan Discussed with: Anesthesiologist, CRNA and Surgeon  Anesthesia Plan Comments: (Patient consented for risks of anesthesia including but not limited to:  - adverse reactions to medications - damage to eyes, teeth, lips or other oral mucosa - nerve damage due to positioning  - sore throat or hoarseness - Damage to heart, brain, nerves, lungs, other parts of body or loss of life  Patient voiced understanding.)        Anesthesia Quick Evaluation

## 2022-02-15 LAB — SURGICAL PATHOLOGY

## 2022-02-24 ENCOUNTER — Ambulatory Visit: Payer: Managed Care, Other (non HMO) | Admitting: Obstetrics and Gynecology

## 2022-02-24 ENCOUNTER — Encounter: Payer: Self-pay | Admitting: Obstetrics and Gynecology

## 2022-02-24 VITALS — BP 104/52 | HR 86 | Ht 62.0 in | Wt 306.0 lb

## 2022-02-24 DIAGNOSIS — Z30431 Encounter for routine checking of intrauterine contraceptive device: Secondary | ICD-10-CM | POA: Diagnosis not present

## 2022-02-24 NOTE — Progress Notes (Signed)
    GYNECOLOGY OFFICE ENCOUNTER NOTE  History:  36 y.o. G1P1001 here today for today for IUD string check; Mirena  IUD was placed  01/21/2022. No complaints about the IUD, no concerning side effects.  Of note, patient reports that she also recently underwent surgery for her gallbladder. Is doing well.   The following portions of the patient's history were reviewed and updated as appropriate: allergies, current medications, past family history, past medical history, past social history, past surgical history and problem list. Last pap smear on 08/18/2020 was normal, negative HRHPV.  Review of Systems:  Pertinent items are noted in HPI.  Objective:  Physical Exam  Blood pressure (!) 104/52, pulse 86, height 5\' 2"  (1.575 m), weight (!) 306 lb (138.8 kg), unknown if currently breastfeeding. Body mass index is 55.97 kg/m.   CONSTITUTIONAL: Well-developed, well-nourished female in no acute distress.  NEUROLOGIC: Alert and oriented to person, place, and time. Normal reflexes, muscle tone coordination.  ABDOMEN: Soft, no distention noted.   PELVIC: Normal appearing external genitalia; normal appearing vaginal mucosa and cervix.  IUD strings visualized, about 0.5 cm in length outside cervix. Done in the presence of a chaperone.  EXTREMITIES: Non-tender, no edema or cyanosis  Assessment & Plan:  Patient to keep IUD in place for up to 8 years; can come in for removal if she desires pregnancy earlier or for any concerning side effects.   , MD [facility]

## 2022-02-25 ENCOUNTER — Encounter: Payer: Self-pay | Admitting: Physician Assistant

## 2022-02-25 ENCOUNTER — Ambulatory Visit (INDEPENDENT_AMBULATORY_CARE_PROVIDER_SITE_OTHER): Payer: Managed Care, Other (non HMO) | Admitting: Physician Assistant

## 2022-02-25 ENCOUNTER — Other Ambulatory Visit: Payer: Self-pay

## 2022-02-25 VITALS — BP 110/73 | HR 79 | Temp 98.6°F | Ht 62.0 in | Wt 302.4 lb

## 2022-02-25 DIAGNOSIS — K8010 Calculus of gallbladder with chronic cholecystitis without obstruction: Secondary | ICD-10-CM

## 2022-02-25 DIAGNOSIS — Z09 Encounter for follow-up examination after completed treatment for conditions other than malignant neoplasm: Secondary | ICD-10-CM

## 2022-02-25 NOTE — Patient Instructions (Signed)

## 2022-02-25 NOTE — Progress Notes (Signed)
[facility] POST-OP OFFICE VISIT  02/25/2022  HPI: Kristina Mccall is a 36 y.o. female 13 days s/p robotic assisted laparoscopic cholecystectomy for [facility] with Dr Claudine Mouton   She is doing well Still with soreness at camera port site; improving; using sporadic ibuprofen No fever, chills, nausea, emesis, or bowel changes No issues with incisions themselves No other complaints   Vital signs: BP 110/73   Pulse 79   Temp 98.6 F (37 C) (Oral)   Ht 5\' 2"  (1.575 m)   Wt (!) 302 lb 6.4 oz (137.2 kg)   LMP  (LMP Unknown)   SpO2 98%   BMI 55.31 kg/m    Physical Exam: Constitutional: Well appearing female, NAD Abdomen: Soft, non-tender, non-distended, no rebound/guarding Skin: Laparoscopic incisions are healing well, no erythema or drainage   Assessment/Plan: This is a 36 y.o. female 13 days s/p robotic assisted laparoscopic cholecystectomy for [facility] with Dr 31    - Pain control prn  - Reviewed wound care recommendation  - Reviewed lifting restrictions; 4 weeks total  - Reviewed surgical pathology; [facility]  - She can follow up on as needed basis; She understands to call with questions/concerns  -- Claudine Mouton, PA-C [facility] 02/25/2022, 2:23 PM M-F: 7am - 4pm

## 2022-04-01 ENCOUNTER — Encounter: Payer: Self-pay | Admitting: Obstetrics and Gynecology

## 2023-09-14 ENCOUNTER — Ambulatory Visit: Payer: Self-pay | Admitting: Obstetrics and Gynecology

## 2023-10-04 ENCOUNTER — Other Ambulatory Visit (HOSPITAL_COMMUNITY)
Admission: RE | Admit: 2023-10-04 | Discharge: 2023-10-04 | Disposition: A | Discharge status: Home / Self Care | Source: Ambulatory Visit | Attending: Obstetrics and Gynecology | Admitting: Obstetrics and Gynecology

## 2023-10-04 ENCOUNTER — Encounter: Payer: Self-pay | Admitting: Obstetrics and Gynecology

## 2023-10-04 ENCOUNTER — Ambulatory Visit (INDEPENDENT_AMBULATORY_CARE_PROVIDER_SITE_OTHER): Admitting: Obstetrics and Gynecology

## 2023-10-04 VITALS — BP 114/69 | HR 87 | Ht 63.0 in | Wt 328.0 lb

## 2023-10-04 DIAGNOSIS — Z1329 Encounter for screening for other suspected endocrine disorder: Secondary | ICD-10-CM

## 2023-10-04 DIAGNOSIS — Z124 Encounter for screening for malignant neoplasm of cervix: Secondary | ICD-10-CM | POA: Diagnosis present

## 2023-10-04 DIAGNOSIS — R5382 Chronic fatigue, unspecified: Secondary | ICD-10-CM

## 2023-10-04 DIAGNOSIS — Z1151 Encounter for screening for human papillomavirus (HPV): Secondary | ICD-10-CM | POA: Insufficient documentation

## 2023-10-04 DIAGNOSIS — Z01419 Encounter for gynecological examination (general) (routine) without abnormal findings: Secondary | ICD-10-CM | POA: Diagnosis not present

## 2023-10-04 DIAGNOSIS — Z131 Encounter for screening for diabetes mellitus: Secondary | ICD-10-CM

## 2023-10-04 DIAGNOSIS — Z13220 Encounter for screening for lipoid disorders: Secondary | ICD-10-CM

## 2023-10-04 DIAGNOSIS — Z0000 Encounter for general adult medical examination without abnormal findings: Secondary | ICD-10-CM

## 2023-10-04 DIAGNOSIS — T8332XA Displacement of intrauterine contraceptive device, initial encounter: Secondary | ICD-10-CM

## 2023-10-04 DIAGNOSIS — Z30431 Encounter for routine checking of intrauterine contraceptive device: Secondary | ICD-10-CM

## 2023-10-04 NOTE — Patient Instructions (Signed)
 I value your feedback and you entrusting [place] with your care. If you get a [place] patient survey, I would appreciate you taking the time to let us know about your experience today. Thank you! ? ? ?

## 2023-10-04 NOTE — Progress Notes (Signed)
 PCP:  Ailene Ravel, MD   Chief Complaint  Patient presents with   Gynecologic Exam    No concerns     HPI:      [name] is a 38 y.o. G0P0000 who LMP was No LMP recorded. (Menstrual status: IUD)., presents today for her annual examination.  Her menses are absent with IUD. No BTB/dysmen. Hx of irreg menses in past but has been on pills, depo and IUD.   Sex activity: single partner, contraception - Mirena IUD placed 01/21/22 PP. No pain/bleeding/dryness Last pap: 08/18/20 Results were NILM/neg HPV DNA Hx of STDs: HPV/abnormal paps 2009/2010  There is no FH of breast cancer. There is no FH of ovarian cancer. The patient does do self-breast exams.  Tobacco use: The patient denies current or previous tobacco use. Alcohol use: social drinker No drug use.  Exercise: moderately active  She does get adequate calcium but not Vitamin D in her diet. No recent fasting labs. Seeing cardio later this wk for heart palpitation past few wks. Hx of SVT with pregnancy so has f/u.  Also with fatigue, sleeping 8 hrs nightly. Not exercising.   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   lgsil/hpv present    Past Surgical History:  Procedure Laterality Date   CESAREAN SECTION N/A 10/22/2021   Procedure: CESAREAN SECTION PRIMARY LOW-TRANSVERSE;  Surgeon: Hildred Laser, MD;  Location: [facility] ORS;  Service: Obstetrics;  Laterality: N/A;   CHOLECYSTECTOMY  01/2022   COLPOSCOPY  2009/2010   no bx done   INTRAUTERINE DEVICE (IUD) INSERTION  12/2021    Family History  Problem Relation Age of Onset    Cervical cancer Mother        unknown age   Hyperlipidemia Mother    Hypertension Mother    Hyperlipidemia Father    Diabetes Maternal Grandmother    Heart disease Maternal Grandfather     Social History   Socioeconomic History   Marital status: Married    Spouse name: Not on file   Number of children: Not on file   Years of education: Not on file   Highest education level: Not on file  Occupational History   Occupation: Urgent Care  Tobacco Use   Smoking status: Never   Smokeless tobacco: Never  Vaping Use   Vaping status: Never Used  Substance and Sexual Activity   Alcohol use: Not Currently   Drug use: No   Sexual activity: Yes    Birth control/protection: I.U.D.  Other Topics Concern   Not on file  Social History Narrative   Not on file   Social Drivers of Health   Financial Resource Strain: Low Risk  (09/16/2023)   Received from [facility]   Overall Financial Resource Strain (CARDIA)    Difficulty of Paying Living Expenses: Not hard at all  Food Insecurity: No Food Insecurity (09/16/2023)   Received from [facility]   Hunger Vital Sign    Worried About Running Out of Food in the Last Year: Never true    Ran  Out of Food in the Last Year: Never true  Transportation Needs: No Transportation Needs (09/16/2023)   Received from [facility] - Transportation    In the past 12 months, has lack of transportation kept you from medical appointments or from getting medications?: No    Lack of Transportation (Non-Medical): No  Physical Activity: Sufficiently Active (08/04/2017)   Exercise Vital Sign    Days of Exercise per Week: 4 days    Minutes of Exercise per Session: 60 min  Stress: Stress Concern Present (08/04/2017)   [employer] of Occupational Health - Occupational Stress Questionnaire    Feeling of Stress : To some extent  Social Connections: Somewhat Isolated (08/04/2017)   Social Connection and  Isolation Panel [NHANES]    Frequency of Communication with Friends and Family: More than three times a week    Frequency of Social Gatherings with Friends and Family: More than three times a week    Attends Religious Services: Never    Database administrator or Organizations: No    Attends Banker Meetings: Never    Marital Status: Living with partner  Intimate Partner Violence: Not At Risk (08/04/2017)   Humiliation, Afraid, Rape, and Kick questionnaire    Fear of Current or Ex-Partner: No    Emotionally Abused: No    Physically Abused: No    Sexually Abused: No    Current Meds  Medication Sig   levonorgestrel (MIRENA) 20 MCG/DAY IUD 1 each by Intrauterine route once.     ROS:  Review of Systems  Constitutional:  Positive for fatigue. Negative for fever and unexpected weight change.  Respiratory:  Negative for cough, shortness of breath and wheezing.   Cardiovascular:  Negative for chest pain, palpitations and leg swelling.  Gastrointestinal:  Negative for blood in stool, constipation, diarrhea, nausea and vomiting.  Endocrine: Negative for cold intolerance, heat intolerance and polyuria.  Genitourinary:  Negative for dyspareunia, dysuria, flank pain, frequency, genital sores, hematuria, menstrual problem, pelvic pain, urgency, vaginal bleeding, vaginal discharge and vaginal pain.  Musculoskeletal:  Negative for back pain, joint swelling and myalgias.  Skin:  Negative for rash.  Neurological:  Negative for dizziness, syncope, light-headedness, numbness and headaches.  Hematological:  Negative for adenopathy.  Psychiatric/Behavioral:  Negative for agitation, confusion, dysphoric mood, hallucinations, sleep disturbance and suicidal ideas. The patient is not nervous/anxious.      Objective: BP 114/69   Pulse 87   Ht 5\' 3"  (1.6 m)   Wt (!) 328 lb (148.8 kg)   Breastfeeding No   BMI 58.10 kg/m    Physical Exam Constitutional:      Appearance: She is  well-developed.  Genitourinary:     Vulva normal.     Genitourinary Comments: IUD STRINGS NOT IN CX OS     Right Labia: No rash, tenderness or lesions.    Left Labia: No tenderness, lesions or rash.    No vaginal discharge, erythema or tenderness.      Right Adnexa: not tender and no mass present.    Left Adnexa: not tender and no mass present.    No cervical motion tenderness, friability or polyp.     No IUD strings visualized.     Uterus is not enlarged or tender.  Breasts:    Right: No mass, nipple discharge, skin change or tenderness.     Left: No mass, nipple discharge, skin change or tenderness.  Neck:     Thyroid: No thyromegaly.  Cardiovascular:     Rate and Rhythm: Normal rate and regular rhythm.     Heart sounds: Normal heart sounds. No murmur heard. Pulmonary:     Effort: Pulmonary effort is normal.     Breath sounds: Normal breath sounds.  Abdominal:     Palpations: Abdomen is soft.     Tenderness: There is no abdominal tenderness. There is no guarding or rebound.  Musculoskeletal:        General: Normal range of motion.     Cervical back: Normal range of motion.  Lymphadenopathy:     Cervical: No cervical adenopathy.  Neurological:     General: No focal deficit present.     Mental Status: She is alert and oriented to person, place, and time.     Cranial Nerves: No cranial nerve deficit.  Skin:    General: Skin is warm and dry.  Psychiatric:        Mood and Affect: Mood normal.        Behavior: Behavior normal.        Thought Content: Thought content normal.        Judgment: Judgment normal.  Vitals reviewed.     Assessment/Plan: Encounter for annual routine gynecological examination  Cervical cancer screening - Plan: Cytology - PAP  Screening for HPV (human papillomavirus) - Plan: Cytology - PAP  Encounter for routine checking of intrauterine contraceptive device (IUD) - Plan: US PELVIS TRANSVAGINAL NON-OB (TV ONLY); IUD strings not in cx os. Has 8  yr indication  Intrauterine contraceptive device threads lost, initial encounter - Plan: US PELVIS TRANSVAGINAL NON-OB (TV ONLY); check GYN u/s for IUD placement  Blood tests for routine general physical examination - Plan: Comprehensive metabolic panel with GFR, Hemoglobin A1c, Lipid panel, TSH + free T4  Screening cholesterol level - Plan: Lipid panel  Thyroid disorder screening - Plan: TSH + free T4  Screening for diabetes mellitus - Plan: Hemoglobin A1c  Chronic fatigue - Plan: TSH + free T4             GYN counsel adequate intake of calcium and vitamin D, diet and exercise     F/U  Return in about 1 week (around 10/11/2023) for GYN u/s for IUD placement--[facility] to call with results.  Clarabel Marion B. Leodan Bolyard, PA-C 10/04/2023 4:42 PM

## 2023-10-05 ENCOUNTER — Encounter: Payer: Self-pay | Admitting: Obstetrics and Gynecology

## 2023-10-06 LAB — CYTOLOGY - PAP
Comment: NEGATIVE
Diagnosis: NEGATIVE
High risk HPV: NEGATIVE

## 2023-10-07 ENCOUNTER — Encounter: Payer: Self-pay | Admitting: Cardiology

## 2023-10-07 ENCOUNTER — Ambulatory Visit: Attending: Cardiology

## 2023-10-07 ENCOUNTER — Ambulatory Visit: Payer: Self-pay | Admitting: Cardiology

## 2023-10-07 VITALS — BP 110/74 | HR 93 | Ht 63.0 in | Wt 323.0 lb

## 2023-10-07 DIAGNOSIS — I471 Supraventricular tachycardia, unspecified: Secondary | ICD-10-CM | POA: Diagnosis not present

## 2023-10-07 NOTE — Patient Instructions (Signed)
 Medication Instructions:  Your physician recommends that you continue on your current medications as directed. Please refer to the Current Medication list given to you today.  *If you need a refill on your cardiac medications before your next appointment, please call your pharmacy*  Testing/Procedures: ZIO XT- Long Term Monitor Instructions  Your physician has requested you wear a ZIO patch monitor for 14 days.  This is a single patch monitor. [employer] supplies one patch monitor per enrollment. Additional stickers are not available. Please do not apply patch if you will be having a Nuclear Stress Test,  Echocardiogram, Cardiac CT, MRI, or Chest Xray during the period you would be wearing the  monitor. The patch cannot be worn during these tests. You cannot remove and re-apply the  ZIO XT patch monitor.  Your ZIO patch monitor will be mailed 3 day USPS to your address on file. It may take 3-5 days  to receive your monitor after you have been enrolled.  Once you have received your monitor, please review the enclosed instructions. Your monitor  has already been registered assigning a specific monitor serial # to you.  Billing and Patient Assistance Program Information  We have supplied [employer] with any of your insurance information on file for billing purposes. [employer] offers a sliding scale Patient Assistance Program for patients that do not have  insurance, or whose insurance does not completely cover the cost of the ZIO monitor.  You must apply for the Patient Assistance Program to qualify for this discounted rate.  To apply, please call [employer] at [phone], select option 4, select option 2, ask to apply for  Patient Assistance Program. Meredeth Ide will ask your household income, and how many people  are in your household. They will quote your out-of-pocket cost based on that information.  [employer] will also be able to set up a 6-month, interest-free payment plan if needed.  Applying the  monitor   Shave hair from upper left chest.  Hold abrader disc by orange tab. Rub abrader in 40 strokes over the upper left chest as  indicated in your monitor instructions.  Clean area with 4 enclosed alcohol pads. Let dry.  Apply patch as indicated in monitor instructions. Patch will be placed under collarbone on left  side of chest with arrow pointing upward.  Rub patch adhesive wings for 2 minutes. Remove white label marked "1". Remove the white  label marked "2". Rub patch adhesive wings for 2 additional minutes.  While looking in a mirror, press and release button in center of patch. A small green light will  flash 3-4 times. This will be your only indicator that the monitor has been turned on.  Do not shower for the first 24 hours. You may shower after the first 24 hours.  Press the button if you feel a symptom. You will hear a small click. Record Date, Time and  Symptom in the Patient Logbook.  When you are ready to remove the patch, follow instructions on the last 2 pages of Patient  Logbook. Stick patch monitor onto the last page of Patient Logbook.  Place Patient Logbook in the blue and white box. Use locking tab on box and tape box closed  securely. The blue and white box has prepaid postage on it. Please place it in the mailbox as  soon as possible. Your physician should have your test results approximately 7 days after the  monitor has been mailed back to [facility]  Call [facility] Customer Care at  [phone] if you have questions regarding  your ZIO XT patch monitor. Call them immediately if you see an orange light blinking on your  monitor.  If your monitor falls off in less than 4 days, contact our Monitor department at [phone].  If your monitor becomes loose or falls off after 4 days call [employer] at [phone] for  suggestions on securing your monitor   Follow-Up: At [facility], you and your health needs are our priority.  As part of  our continuing mission to provide you with exceptional heart care, our providers are all part of one team.  This team includes your primary Cardiologist (physician) and Advanced Practice Providers or APPs (Physician Assistants and Nurse Practitioners) who all work together to provide you with the care you need, when you need it.  Your next appointment:   1 year(s)  Provider:   Thomasene Ripple, DO     Other Instructions   1st Floor: - Lobby - Registration  - Pharmacy  - Lab - Cafe  2nd Floor: - PV Lab - Diagnostic Testing (echo, CT, nuclear med)  3rd Floor: - Vacant  4th Floor: - TCTS (cardiothoracic surgery) - AFib Clinic - Structural Heart Clinic - Vascular Surgery  - Vascular Ultrasound  5th Floor: - HeartCare Cardiology (general and EP) - Clinical Pharmacy for coumadin, hypertension, lipid, weight-loss medications, and med management appointments    Valet parking services will be available as well.

## 2023-10-07 NOTE — Progress Notes (Unsigned)
 Enrolled patient for a 14 day Zio XT  monitor to be mailed to patients home

## 2023-10-07 NOTE — Progress Notes (Signed)
 Cardio-Obstetrics Clinic  Follow Up Note   Date:  10/08/2023   ID:  Kristina Mccall, DOB May 31, 1986, MRN [id number]  PCP:  Annette Barters, MD   [facility] Providers Cardiologist:  Jerryl Morin, DO  Electrophysiologist:  None        Referring MD: Annette Barters, MD   Chief Complaint: " I am experiencing palpitations"  History of Present Illness:    Kristina Mccall is a 38 y.o. female [G1P1001] who returns for follow up of palpitations.   Medical history for paroxysmal SVT, morbid obesity here today for follow-up visit   She presents with a recurrence of palpitations and dizzy spells over the past two months. She has a history of paroxysmal supraventricular tachycardia (SVT), which was previously managed without medication. The palpitations are not associated with any specific triggers and can occur even when she is at rest. The patient reports that her resting heart rate is typically in the sixties. She also mentions that she has been experiencing dizzy spells, but these do not coincide with the palpitations. The patient is otherwise active and engaged in her work and personal life.   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   lgsil/hpv present    Past Surgical History:  Procedure Laterality Date   CESAREAN SECTION N/A 10/22/2021   Procedure: CESAREAN SECTION PRIMARY LOW-TRANSVERSE;  Surgeon: Teresa Fender, MD;  Location: [facility] ORS;  Service: Obstetrics;  Laterality: N/A;   CHOLECYSTECTOMY  01/2022    COLPOSCOPY  2009/2010   no bx done   INTRAUTERINE DEVICE (IUD) INSERTION  12/2021      OB History     Gravida  1   Para  1   Term  1   Preterm  0   AB  0   Living  1      SAB  0   IAB  0   Ectopic  0   Multiple  0   Live Births  1               Current Medications: Current Meds  Medication Sig   levonorgestrel (MIRENA) 20 MCG/DAY IUD 1 each by Intrauterine route once.     Allergies:   Amoxicillin, Ciprofloxacin, Doxycycline, Hydrocodone, Methylprednisolone sodium succ, Other, Penicillins, and Latex   Social History   Socioeconomic History   Marital status: Married    Spouse name: Not on file   Number of children: Not on file   Years of education: Not on file   Highest education level: Not on file  Occupational History   Occupation: Urgent Care  Tobacco Use   Smoking status: Never   Smokeless tobacco: Never  Vaping Use   Vaping status: Never Used  Substance and Sexual Activity   Alcohol use: Not Currently   Drug use: No   Sexual activity: Yes    Birth control/protection: I.U.D.  Other Topics Concern   Not on file  Social History Narrative   Not on file   Social Drivers  of Health   Financial Resource Strain: Low Risk  (09/16/2023)   Received from [facility]   Overall Financial Resource Strain (CARDIA)    Difficulty of Paying Living Expenses: Not hard at all  Food Insecurity: No Food Insecurity (09/16/2023)   Received from [facility]   Hunger Vital Sign    Worried About Running Out of Food in the Last Year: Never true    Ran Out of Food in the Last Year: Never true  Transportation Needs: No Transportation Needs (09/16/2023)   Received from [facility] - Transportation    In the past 12 months, has lack of transportation kept you from medical appointments or from getting medications?: No    Lack of Transportation (Non-Medical): No  Physical Activity: Sufficiently Active  (08/04/2017)   Exercise Vital Sign    Days of Exercise per Week: 4 days    Minutes of Exercise per Session: 60 min  Stress: Stress Concern Present (08/04/2017)   [employer] of Occupational Health - Occupational Stress Questionnaire    Feeling of Stress : To some extent  Social Connections: Somewhat Isolated (08/04/2017)   Social Connection and Isolation Panel [NHANES]    Frequency of Communication with Friends and Family: More than three times a week    Frequency of Social Gatherings with Friends and Family: More than three times a week    Attends Religious Services: Never    Database administrator or Organizations: No    Attends Engineer, structural: Never    Marital Status: Living with partner      Family History  Problem Relation Age of Onset   Cervical cancer Mother        unknown age   Hyperlipidemia Mother    Hypertension Mother    Hyperlipidemia Father    Diabetes Maternal Grandmother    Heart disease Maternal Grandfather       ROS:   Please see the history of present illness.     All other systems reviewed and are negative.   Labs/EKG Reviewed:    EKG:  None today    Recent Labs: No results found for requested labs within last 365 days.   Recent Lipid Panel Lab Results  Component Value Date/Time   CHOL 163 10/07/2017 08:57 AM   TRIG 56 10/07/2017 08:57 AM   HDL 58 10/07/2017 08:57 AM   CHOLHDL 2.8 10/07/2017 08:57 AM   LDLCALC 94 10/07/2017 08:57 AM    Physical Exam:    VS:  BP 110/74 (BP Location: Left Arm, Patient Position: Sitting, Cuff Size: Large)   Pulse 93   Ht 5\' 3"  (1.6 m)   Wt (!) 323 lb (146.5 kg)   SpO2 98%   BMI 57.22 kg/m     Wt Readings from Last 3 Encounters:  10/07/23 (!) 323 lb (146.5 kg)  10/04/23 (!) 328 lb (148.8 kg)  02/25/22 (!) 302 lb 6.4 oz (137.2 kg)     GEN:  Well nourished, well developed in no acute distress HEENT: Normal NECK: No JVD; No carotid bruits LYMPHATICS: No lymphadenopathy CARDIAC: RRR,  no murmurs, rubs, gallops RESPIRATORY:  Clear to auscultation without rales, wheezing or rhonchi  ABDOMEN: Soft, non-tender, non-distended MUSCULOSKELETAL:  No edema; No deformity  SKIN: Warm and dry NEUROLOGIC:  Alert and oriented x 3 PSYCHIATRIC:  Normal affect    Risk Assessment/Risk Calculators:  ASSESSMENT & PLAN:    Paroxysmal Supraventricular Tachycardia (PSVT) Episodes of palpitations and dizziness suggestive of PSVT. Monitoring required to assess frequency and severity before medication initiation. - Place a heart monitor to assess the burden of PSVT. - Consider propranolol 10 mg if episodes are frequent or severe. - Evaluate the monitor results to determine the need for medication.  Potential Pregnancy Symptoms suggestive of pregnancy despite negative home test. IUD unlocatable, requiring further investigation. - Schedule an ultrasound to locate the IUD. - Conduct blood work, including a pregnancy test, to investigate symptoms.  Fatigue Fatigue possibly related to lifestyle, stress, or vitamin deficiencies. Further evaluation needed. - Order blood work to assess vitamin D levels and other potential causes of fatigue.  Patient Instructions  Medication Instructions:  Your physician recommends that you continue on your current medications as directed. Please refer to the Current Medication list given to you today.  *If you need a refill on your cardiac medications before your next appointment, please call your pharmacy*  Testing/Procedures: ZIO XT- Long Term Monitor Instructions  Your physician has requested you wear a ZIO patch monitor for 14 days.  This is a single patch monitor. [employer] supplies one patch monitor per enrollment. Additional stickers are not available. Please do not apply patch if you will be having a Nuclear Stress Test,  Echocardiogram, Cardiac CT, MRI, or Chest Xray during the period you would be wearing the  monitor. The patch  cannot be worn during these tests. You cannot remove and re-apply the  ZIO XT patch monitor.  Your ZIO patch monitor will be mailed 3 day USPS to your address on file. It may take 3-5 days  to receive your monitor after you have been enrolled.  Once you have received your monitor, please review the enclosed instructions. Your monitor  has already been registered assigning a specific monitor serial # to you.  Billing and Patient Assistance Program Information  We have supplied [employer] with any of your insurance information on file for billing purposes. [employer] offers a sliding scale Patient Assistance Program for patients that do not have  insurance, or whose insurance does not completely cover the cost of the ZIO monitor.  You must apply for the Patient Assistance Program to qualify for this discounted rate.  To apply, please call [employer] at [phone], select option 4, select option 2, ask to apply for  Patient Assistance Program. Sanna Crystal will ask your household income, and how many people  are in your household. They will quote your out-of-pocket cost based on that information.  [employer] will also be able to set up a 26-month, interest-free payment plan if needed.  Applying the monitor   Shave hair from upper left chest.  Hold abrader disc by orange tab. Rub abrader in 40 strokes over the upper left chest as  indicated in your monitor instructions.  Clean area with 4 enclosed alcohol pads. Let dry.  Apply patch as indicated in monitor instructions. Patch will be placed under collarbone on left  side of chest with arrow pointing upward.  Rub patch adhesive wings for 2 minutes. Remove white label marked "1". Remove the white  label marked "2". Rub patch adhesive wings for 2 additional minutes.  While looking in a mirror, press and release button in center of patch. A small green light will  flash 3-4 times. This will be your only indicator that the monitor has been turned on.  Do not  shower for the first 24 hours. You may  shower after the first 24 hours.  Press the button if you feel a symptom. You will hear a small click. Record Date, Time and  Symptom in the Patient Logbook.  When you are ready to remove the patch, follow instructions on the last 2 pages of Patient  Logbook. Stick patch monitor onto the last page of Patient Logbook.  Place Patient Logbook in the blue and white box. Use locking tab on box and tape box closed  securely. The blue and white box has prepaid postage on it. Please place it in the mailbox as  soon as possible. Your physician should have your test results approximately 7 days after the  monitor has been mailed back to [facility].  Call [facility] Customer Care at [phone] if you have questions regarding  your ZIO XT patch monitor. Call them immediately if you see an orange light blinking on your  monitor.  If your monitor falls off in less than 4 days, contact our Monitor department at [phone].  If your monitor becomes loose or falls off after 4 days call [employer] at [phone] for  suggestions on securing your monitor   Follow-Up: At [facility], you and your health needs are our priority.  As part of our continuing mission to provide you with exceptional heart care, our providers are all part of one team.  This team includes your primary Cardiologist (physician) and Advanced Practice Providers or APPs (Physician Assistants and Nurse Practitioners) who all work together to provide you with the care you need, when you need it.  Your next appointment:   1 year(s)  Provider:   Mariana Goytia, DO     Other Instructions   1st Floor: - Lobby - Registration  - Pharmacy  - Lab - Cafe  2nd Floor: - PV Lab - Diagnostic Testing (echo, CT, nuclear med)  3rd Floor: - Vacant  4th Floor: - TCTS (cardiothoracic surgery) - AFib Clinic - Structural Heart Clinic - Vascular Surgery  - Vascular Ultrasound  5th  Floor: - HeartCare Cardiology (general and EP) - Clinical Pharmacy for coumadin, hypertension, lipid, weight-loss medications, and med management appointments    Valet parking services will be available as well.           Dispo:  No follow-ups on file.   Medication Adjustments/Labs and Tests Ordered: Current medicines are reviewed at length with the patient today.  Concerns regarding medicines are outlined above.  Tests Ordered: Orders Placed This Encounter  Procedures   LONG TERM MONITOR (3-14 DAYS)   Medication Changes: No orders of the defined types were placed in this encounter.

## 2023-10-11 ENCOUNTER — Other Ambulatory Visit: Payer: Self-pay | Admitting: Obstetrics and Gynecology

## 2023-10-11 DIAGNOSIS — B9689 Other specified bacterial agents as the cause of diseases classified elsewhere: Secondary | ICD-10-CM

## 2023-10-11 MED ORDER — METRONIDAZOLE 0.75 % VA GEL: 1.0000 | Freq: Every day | VAGINAL | 0 refills | Status: AC

## 2023-10-11 NOTE — Progress Notes (Signed)
Rx metrogel for BV sx 

## 2023-10-17 ENCOUNTER — Other Ambulatory Visit

## 2023-10-17 DIAGNOSIS — Z13220 Encounter for screening for lipoid disorders: Secondary | ICD-10-CM

## 2023-10-17 DIAGNOSIS — R5382 Chronic fatigue, unspecified: Secondary | ICD-10-CM

## 2023-10-17 DIAGNOSIS — Z0000 Encounter for general adult medical examination without abnormal findings: Secondary | ICD-10-CM

## 2023-10-17 DIAGNOSIS — Z131 Encounter for screening for diabetes mellitus: Secondary | ICD-10-CM

## 2023-10-17 DIAGNOSIS — Z1329 Encounter for screening for other suspected endocrine disorder: Secondary | ICD-10-CM

## 2023-10-18 ENCOUNTER — Ambulatory Visit (INDEPENDENT_AMBULATORY_CARE_PROVIDER_SITE_OTHER)

## 2023-10-18 DIAGNOSIS — Z30431 Encounter for routine checking of intrauterine contraceptive device: Secondary | ICD-10-CM

## 2023-10-18 DIAGNOSIS — T8332XA Displacement of intrauterine contraceptive device, initial encounter: Secondary | ICD-10-CM

## 2023-10-18 LAB — COMPREHENSIVE METABOLIC PANEL WITH GFR
ALT: 17 IU/L (ref 0–32)
AST: 17 IU/L (ref 0–40)
Albumin: 4.2 g/dL (ref 3.9–4.9)
Alkaline Phosphatase: 79 IU/L (ref 44–121)
BUN/Creatinine Ratio: 11 (ref 9–23)
BUN: 8 mg/dL (ref 6–20)
Bilirubin Total: 0.4 mg/dL (ref 0.0–1.2)
CO2: 23 mmol/L (ref 20–29)
Calcium: 9.3 mg/dL (ref 8.7–10.2)
Chloride: 104 mmol/L (ref 96–106)
Creatinine, Ser: 0.71 mg/dL (ref 0.57–1.00)
Globulin, Total: 2.6 g/dL (ref 1.5–4.5)
Glucose: 91 mg/dL (ref 70–99)
Potassium: 4.3 mmol/L (ref 3.5–5.2)
Sodium: 142 mmol/L (ref 134–144)
Total Protein: 6.8 g/dL (ref 6.0–8.5)
eGFR: 112 mL/min/{1.73_m2} (ref >59)

## 2023-10-18 LAB — TSH+FREE T4
Free T4: 1.26 ng/dL (ref 0.82–1.77)
TSH: 3.44 u[IU]/mL (ref 0.450–4.500)

## 2023-10-18 LAB — LIPID PANEL
Chol/HDL Ratio: 3.1 ratio (ref 0.0–4.4)
Cholesterol, Total: 168 mg/dL (ref 100–199)
HDL: 55 mg/dL (ref >39)
LDL Chol Calc (NIH): 98 mg/dL (ref 0–99)
Triglycerides: 82 mg/dL (ref 0–149)
VLDL Cholesterol Cal: 15 mg/dL (ref 5–40)

## 2023-10-18 LAB — HEMOGLOBIN A1C
Est. average glucose Bld gHb Est-mCnc: 117 mg/dL
Hgb A1c MFr Bld: 5.7 % — ABNORMAL HIGH (ref 4.8–5.6)

## 2023-10-19 NOTE — Telephone Encounter (Signed)
 Pls ask Mia or Sandy if beta HcG can be added and let pt know. If not, just have her do first AM UPT.

## 2023-10-20 ENCOUNTER — Telehealth: Payer: Self-pay | Admitting: Obstetrics and Gynecology

## 2023-10-20 ENCOUNTER — Encounter: Payer: Self-pay | Admitting: Obstetrics and Gynecology

## 2023-10-20 NOTE — Telephone Encounter (Signed)
 LM with neg GYN u/s results. IUD in correct location. F/u prn.

## 2023-11-01 ENCOUNTER — Other Ambulatory Visit

## 2023-11-07 DIAGNOSIS — I471 Supraventricular tachycardia, unspecified: Secondary | ICD-10-CM

## 2023-11-08 ENCOUNTER — Ambulatory Visit: Payer: Self-pay | Admitting: Cardiology

## 2025-02-06 ENCOUNTER — Ambulatory Visit: Admitting: Family Medicine
